# Patient Record
Sex: Male | Born: 1965 | Race: White | Hispanic: No | Marital: Single | Smoking: Never smoker
Health system: Southern US, Community
[De-identification: ages and names within clinical notes are randomized; demographics above are authoritative.]

## PROBLEM LIST (undated history)

## (undated) DIAGNOSIS — I10 Essential (primary) hypertension: Secondary | ICD-10-CM

## (undated) DIAGNOSIS — R739 Hyperglycemia, unspecified: Secondary | ICD-10-CM

## (undated) DIAGNOSIS — E785 Hyperlipidemia, unspecified: Secondary | ICD-10-CM

## (undated) DIAGNOSIS — K219 Gastro-esophageal reflux disease without esophagitis: Secondary | ICD-10-CM

## (undated) DIAGNOSIS — I2510 Atherosclerotic heart disease of native coronary artery without angina pectoris: Secondary | ICD-10-CM

## (undated) DIAGNOSIS — N189 Chronic kidney disease, unspecified: Secondary | ICD-10-CM

## (undated) HISTORY — PX: EYE SURGERY: SHX253

## (undated) HISTORY — DX: Hyperlipidemia, unspecified: E78.5

## (undated) HISTORY — DX: Essential (primary) hypertension: I10

## (undated) HISTORY — DX: Atherosclerotic heart disease of native coronary artery without angina pectoris: I25.10

## (undated) HISTORY — DX: Chronic kidney disease, unspecified: N18.9

## (undated) HISTORY — DX: Hyperglycemia, unspecified: R73.9

## (undated) HISTORY — DX: Gastro-esophageal reflux disease without esophagitis: K21.9

---

## 2011-03-17 ENCOUNTER — Inpatient Hospital Stay (HOSPITAL_COMMUNITY): Payer: Self-pay

## 2011-03-17 ENCOUNTER — Inpatient Hospital Stay (HOSPITAL_COMMUNITY)
Admission: EM | Admit: 2011-03-17 | Discharge: 2011-03-21 | DRG: 247 | Disposition: A | Discharge status: Home / Self Care | Payer: Self-pay | Source: Other Acute Inpatient Hospital | Attending: Cardiology | Admitting: Cardiology

## 2011-03-17 DIAGNOSIS — I2510 Atherosclerotic heart disease of native coronary artery without angina pectoris: Secondary | ICD-10-CM | POA: Diagnosis present

## 2011-03-17 DIAGNOSIS — R42 Dizziness and giddiness: Secondary | ICD-10-CM | POA: Diagnosis not present

## 2011-03-17 DIAGNOSIS — I369 Nonrheumatic tricuspid valve disorder, unspecified: Secondary | ICD-10-CM

## 2011-03-17 DIAGNOSIS — I2109 ST elevation (STEMI) myocardial infarction involving other coronary artery of anterior wall: Principal | ICD-10-CM | POA: Diagnosis present

## 2011-03-17 DIAGNOSIS — R079 Chest pain, unspecified: Secondary | ICD-10-CM

## 2011-03-17 DIAGNOSIS — I10 Essential (primary) hypertension: Secondary | ICD-10-CM | POA: Diagnosis present

## 2011-03-17 LAB — PROTIME-INR: INR: 4.58 — ABNORMAL HIGH (ref 0.00–1.49)

## 2011-03-17 LAB — CBC
HCT: 35.9 % — ABNORMAL LOW (ref 39.0–52.0)
MCH: 30.8 pg (ref 26.0–34.0)
MCHC: 36.5 g/dL — ABNORMAL HIGH (ref 30.0–36.0)
RDW: 12.7 % (ref 11.5–15.5)

## 2011-03-17 LAB — COMPREHENSIVE METABOLIC PANEL
Albumin: 3.5 g/dL (ref 3.5–5.2)
Alkaline Phosphatase: 88 U/L (ref 39–117)
BUN: 23 mg/dL (ref 6–23)
CO2: 20 mEq/L (ref 19–32)
Chloride: 103 mEq/L (ref 96–112)
Creatinine, Ser: 1.54 mg/dL — ABNORMAL HIGH (ref 0.4–1.5)
GFR calc non Af Amer: 50 mL/min — ABNORMAL LOW (ref >60)
Glucose, Bld: 158 mg/dL — ABNORMAL HIGH (ref 70–99)
Potassium: 3.5 mEq/L (ref 3.5–5.1)
Total Bilirubin: 0.9 mg/dL (ref 0.3–1.2)

## 2011-03-17 LAB — POCT I-STAT, CHEM 8
HCT: 41 % (ref 39.0–52.0)
Hemoglobin: 13.9 g/dL (ref 13.0–17.0)
Potassium: 3.3 mEq/L — ABNORMAL LOW (ref 3.5–5.1)
Sodium: 139 mEq/L (ref 135–145)
TCO2: 21 mmol/L (ref 0–100)

## 2011-03-17 LAB — CARDIAC PANEL(CRET KIN+CKTOT+MB+TROPI)
CK, MB: 210.2 ng/mL (ref 0.3–4.0)
CK, MB: 298.2 ng/mL (ref 0.3–4.0)
Relative Index: 11.8 — ABNORMAL HIGH (ref 0.0–2.5)
Total CK: 3512 U/L — ABNORMAL HIGH (ref 7–232)

## 2011-03-17 LAB — LIPID PANEL
Cholesterol: 181 mg/dL (ref 0–200)
LDL Cholesterol: 129 mg/dL — ABNORMAL HIGH (ref 0–99)
Triglycerides: 120 mg/dL (ref <150)

## 2011-03-17 LAB — HEMOGLOBIN A1C: Hgb A1c MFr Bld: 5.8 % — ABNORMAL HIGH (ref <5.7)

## 2011-03-17 LAB — APTT: aPTT: 183 seconds — ABNORMAL HIGH (ref 24–37)

## 2011-03-17 LAB — POCT ACTIVATED CLOTTING TIME: Activated Clotting Time: 358 seconds

## 2011-03-17 LAB — MRSA PCR SCREENING: MRSA by PCR: NEGATIVE

## 2011-03-18 LAB — CARDIAC PANEL(CRET KIN+CKTOT+MB+TROPI)
CK, MB: 159 ng/mL (ref 0.3–4.0)
Total CK: 2459 U/L — ABNORMAL HIGH (ref 7–232)
Troponin I: 74.99 ng/mL (ref 0.00–0.06)

## 2011-03-18 LAB — CBC
MCH: 30.7 pg (ref 26.0–34.0)
MCV: 85.8 fL (ref 78.0–100.0)
Platelets: 169 10*3/uL (ref 150–400)
RBC: 4.5 MIL/uL (ref 4.22–5.81)
RDW: 12.9 % (ref 11.5–15.5)
WBC: 9.5 10*3/uL (ref 4.0–10.5)

## 2011-03-18 LAB — BASIC METABOLIC PANEL
BUN: 15 mg/dL (ref 6–23)
Chloride: 103 mEq/L (ref 96–112)
Creatinine, Ser: 1.43 mg/dL (ref 0.4–1.5)
GFR calc Af Amer: >60 mL/min (ref >60)
GFR calc non Af Amer: 54 mL/min — ABNORMAL LOW (ref >60)

## 2011-03-19 LAB — CBC
Platelets: 179 10*3/uL (ref 150–400)
RBC: 4.32 MIL/uL (ref 4.22–5.81)
RDW: 12.8 % (ref 11.5–15.5)
WBC: 10.6 10*3/uL — ABNORMAL HIGH (ref 4.0–10.5)

## 2011-03-19 LAB — BASIC METABOLIC PANEL
Chloride: 102 mEq/L (ref 96–112)
GFR calc Af Amer: >60 mL/min (ref >60)
GFR calc non Af Amer: 50 mL/min — ABNORMAL LOW (ref >60)
Potassium: 4.3 mEq/L (ref 3.5–5.1)
Sodium: 136 mEq/L (ref 135–145)

## 2011-03-19 NOTE — Procedures (Signed)
NAMEKREG, EARHART               ACCOUNT NO.:  [id number]  MEDICAL RECORD NO.:  [id number]           PATIENT TYPE:  I  LOCATION:  2807                         FACILITY:  [facility]  PHYSICIAN:  Arturo Morton. Riley Kill, MD, FACCDATE OF BIRTH:  1966/04/08  DATE OF PROCEDURE:  03/17/2011 DATE OF DISCHARGE:                           CARDIAC CATHETERIZATION   INDICATIONS:  Mr. Pettway is a 45 year old with no prior cardiac history.  He has a history of hypertension treated with lisinopril.  He presented with an acute onset of chest pain.  He went from [place] to the [facility] emergency room where EKG was compatible with an anterior wall infarction.  EMS was activated, the cath lab was activated and he was brought emergently to the [facility] cath lab.  On arrival here, his pain was markedly improved.  We discussed with him quickly the indications for emergent catheterization and he was agreeable to proceed.  PROCEDURE: 1. Left heart catheterization. 2. Selective coronary arteriography. 3. Percutaneous stenting of the left anterior descending artery with a     drug-eluting stent.  DESCRIPTION OF PROCEDURE:  The patient was brought to the catheterization laboratory and prepped and draped in the usual fashion. We elected to use the right radial artery.  His Allen test was normal. We were able to gain access through the right radial artery and a 6- French sheath was placed.  We immediately obtained an i-STAT creatinine and hemoglobin.  Creatinine was elevated at 1.7 so plans were made to do this without ventriculography.  Views of the right coronary artery were obtained followed by views of the left coronary artery using a JL-3.5. Intra-arterial verapamil 3 mg was administered at the beginning, the patient had already received heparin.  With this, the patient was noted to have a subtotal occlusion of the LAD at bifurcation.  We gave bivalirudin, and Prasugrel, aspirin had already been given.  ACT  was checked and found to be appropriate for percutaneous intervention.  We wired the diagonal branch using a traverse wire and wired the main branch using a Prowater wire.  Dilatation was done in the main branch with a 2 mm and a 2.5 mm balloon.  There was a fair amount of elastic recoil.  As a result, I elected to go ahead and stent the left anterior descending artery.  A 2.75 x 16 drug-eluting PROMUS Element stent was then passed across the lesion, dilatation was done up to about 10 atmospheres.  We wanted to see the preservation of the diagonal branch. Postdilatation was then done with a 3-mm [place] trek balloon.  We went back and after the first set of post dilatations and went back and did a 15- atmosphere inflation.  The diagonal branch remained patent.  A wire had been left in place and was jailed.  We pulled back the other wire, we were able to cross into the area of the wire.  The J-wire was then removed without difficulty.  After several views of keeping the patient on the table, the diagonal remained patent as a result we elected not to dilate the diagonal branch.  There was TIMI 3  flow in this branch.  At the beginning of the case, there was TIMI 2 flow in the left anterior descending artery.  Left ventricular pressures were measured at the end of the case.  Additional views of the LAD, specifically scout views were now obtained to reduce contrast load.  A ventriculography was also not performed with plan of performing 2-D echocardiography.  Of note, during the course of the procedure, the patient was accelerated to a ventricular rhythm.  He converted intermittently to normal sinus rhythm, but most of the time was in AIVR with pressure in the 80s and 90s, with conversion to sinus rhythm his pressure came up into the 100s. A TR band was placed at the completion of the procedure and the patient was taken to the holding area in satisfactory clinical condition.  HEMODYNAMIC  DATA.: 1. The central aortic pressure was 92/72 with a mean of 81. 2. RV pressure 89/16. 3. There was no gradient across the aortic valve.  ANGIOGRAPHIC DATA: 1. The right coronary artery has mild luminal irregularity without     critical stenoses. 2. The left main is free of critical disease. 3. The LAD has a subtotal occlusion with 95% narrowing at the takeoff     of the first diagonal, the first diagonal has about 70% ostial     narrowing.  Following the balloon dilatation and stenting, this 95%     stenosis was reduced to 0% residual luminal narrowing.  The     diagonal branch remained patent with TIMI 3 flow, it was perhaps     blood tinged slightly more, but again with TIMI 3 flow further     dilatations were not done to avoid deformation of the stent.  The     left anterior descending artery demonstrates an additional 50% area     of narrowing.  The takeoff of the second diagonal which itself has     a 70% ostial stenosis.  It does appear to be patent best seen in     the LAO caudal views.  There was a distal area of modest narrowing,     but this was in an area of pseudo stenoses.  This was not felt to     be high-grade.  After completion of the procedure, there was TIMI 3     flow to the apex.  CONCLUSIONS: 1. Acute anterior wall myocardial infarction due to occlusion of the     left anterior descending artery with successful percutaneous     stenting of the bifurcation lesion. 2. Continued patency of the first diagonal. 3. Modest plaque of the distal LAD and diagonal branches as noted     above.  DISPOSITION: 1. The patient will be treated with aspirin and Prasugrel. 2. When he resumes to sinus, we will get beta blockers started. 3. He will be started on simvastatin 40 mg, so his medication remains     affordable as he is unemployed. 4. Care management would be obtained. 5. Cardiac rehabilitation will be obtained.  ADDENDUM:  Time of arrival to first inflation was  approximately 38 minutes at the transferring facility.     Arturo Morton. Riley Kill, MD, [facility]     TDS/MEDQ  D:  03/17/2011  T:  03/17/2011  Job:  [id number]  Electronically Signed by Shawnie Pons MD [facility] on 03/19/2011 05:45:10 AM

## 2011-03-21 DIAGNOSIS — I2109 ST elevation (STEMI) myocardial infarction involving other coronary artery of anterior wall: Secondary | ICD-10-CM

## 2011-03-26 NOTE — Discharge Summary (Signed)
Jason Daugherty, Jason Daugherty               ACCOUNT NO.:  [id number]  MEDICAL RECORD NO.:  [id number]           PATIENT TYPE:  I  LOCATION:  2024                         FACILITY:  [facility]  PHYSICIAN:  Jason Pick. Jason Emms, MD, FACCDATE OF BIRTH:  Sep 23, 1966  DATE OF ADMISSION:  03/17/2011 DATE OF DISCHARGE:  03/21/2011                              DISCHARGE SUMMARY   PROCEDURES: 1. Cardiac catheterization. 2. Coronary arteriogram. 3. 2.75 x 16-mm PROMUS drug-eluting stent to the left anterior     descending (coronary) artery.Marland Kitchen 4. Two-D echocardiogram. 5. Chest x-ray.  PRIMARY FINAL DISCHARGE DIAGNOSIS:  Acute anterior ST elevation myocardial infarction.  SECONDARY DIAGNOSES: 1. Hyperglycemia with blood sugars between 118 and 198, hemoglobin A1c     5.8. 2. Dyslipidemia with a total cholesterol of 181, triglycerides 120,     HDL 28, LDL 129. 3. Renal insufficiency, duration unknown with a BUN of 20, creatinine     1.54, and GFR of 50 at discharge. 4. Hypertension. 5. Gout. 6. Gastroesophageal reflux disease. 7. History of eye surgery as a child.  FAMILY HISTORY:  Coronary artery disease in his father.  TIME OF DISCHARGE:  41 minutes.  HOSPITAL COURSE:  Jason Daugherty is a 45 year old male with no previous history of coronary artery disease.  He had chest pain and went to [facility] where his EKG and symptoms indicated an ST elevation MI.  He received appropriate treatment and was transferred urgently to [facility] where he was taken directly to the cath lab.  Jason Daugherty had a 95% LAD lesion at the first diagonal which was treated with PTCA and a drug-eluting stent reducing the stenosis to zero.  He had a more distal lesion in the LAD at 50% which was at the second diagonal.  The second diagonal had a 70% stenosis.  His creatinine was elevated at 1.7 so no LV gram was done.  The circumflex and RCA systems were without disease.  He was admitted for further evaluation.  A lipid  profile as described above and he was started on a statin.  He was on Effient and aspirin for anticoagulation.  A beta blocker was added to his medication regimen and his lisinopril was held.  He was seen by Cardiac Rehab.  A 2-D echocardiogram showed septal, apical and distal inferior wall akinesis with otherwise hyperdynamic function, moderate concentric LVH and an EF of 40-45%.  His peak troponin was greater than 100 and his peak CK-MB was 3512/298.2.  He was held for 48 hours.  Medical assistance form was filled out for Effient.  IMPRESSION:  On March 21, 2011, Jason Daugherty was ambulating without chest pain or shortness of breath.  He was evaluated by Dr. Eden Daugherty and considered stable for discharge, to follow up as an outpatient.  DISCHARGE INSTRUCTIONS:  His activity level is to be increased gradually.  He is to call our office for problems with the cath site. He is encouraged to stick to a low-sodium heart-healthy diet.  He is to follow up with Dr. Riley Daugherty and our office will call him with an appointment.  He is to follow up with  the clinic in [place] as scheduled.  DISCHARGE MEDICATIONS: 1. Lopressor 25 mg b.i.d. 2. Pravachol 40 mg at bedtime. 3. Aspirin 81 mg a day. 4. Prasugrel 10 mg daily. 5. Prilosec 20 mg daily. 6. Cherry supplement for gout as prior to admission. 7. Sublingual nitroglycerin p.r.n.     Jason Demark, PA-C   ______________________________ Jason Pick. Jason Emms, MD, [facility]    RB/MEDQ  D:  03/21/2011  T:  03/22/2011  Job:  [id number]  Electronically Signed by Jason Demark PA-C on 03/25/2011 09:45:06 AM Electronically Signed by Jason Haws MD FACC on 03/26/2011 11:31:43 AM

## 2011-03-31 ENCOUNTER — Telehealth: Payer: Self-pay

## 2011-03-31 NOTE — Telephone Encounter (Signed)
I spoke with the pt and made him aware that he was approved for [employer] (Effient). Medication placed at the front desk for pick-up.  The pt will get this at his upcoming appt next week. Order #[id number], PO #[id number]

## 2011-04-04 ENCOUNTER — Encounter: Payer: Self-pay | Admitting: Physician Assistant

## 2011-04-08 ENCOUNTER — Ambulatory Visit (INDEPENDENT_AMBULATORY_CARE_PROVIDER_SITE_OTHER): Payer: Self-pay | Admitting: Physician Assistant

## 2011-04-08 ENCOUNTER — Other Ambulatory Visit: Payer: Self-pay | Admitting: *Deleted

## 2011-04-08 ENCOUNTER — Encounter: Payer: Self-pay | Admitting: Physician Assistant

## 2011-04-08 VITALS — BP 112/78 | HR 56 | Ht 70.0 in | Wt 184.4 lb

## 2011-04-08 DIAGNOSIS — K219 Gastro-esophageal reflux disease without esophagitis: Secondary | ICD-10-CM | POA: Insufficient documentation

## 2011-04-08 DIAGNOSIS — M109 Gout, unspecified: Secondary | ICD-10-CM | POA: Insufficient documentation

## 2011-04-08 DIAGNOSIS — E785 Hyperlipidemia, unspecified: Secondary | ICD-10-CM

## 2011-04-08 DIAGNOSIS — N189 Chronic kidney disease, unspecified: Secondary | ICD-10-CM | POA: Insufficient documentation

## 2011-04-08 DIAGNOSIS — I2510 Atherosclerotic heart disease of native coronary artery without angina pectoris: Secondary | ICD-10-CM | POA: Insufficient documentation

## 2011-04-08 DIAGNOSIS — I10 Essential (primary) hypertension: Secondary | ICD-10-CM

## 2011-04-08 DIAGNOSIS — R739 Hyperglycemia, unspecified: Secondary | ICD-10-CM | POA: Insufficient documentation

## 2011-04-08 LAB — BASIC METABOLIC PANEL
CO2: 28 mEq/L (ref 19–32)
Calcium: 9.3 mg/dL (ref 8.4–10.5)
Chloride: 100 mEq/L (ref 96–112)
Glucose, Bld: 102 mg/dL — ABNORMAL HIGH (ref 70–99)
Potassium: 3.7 mEq/L (ref 3.5–5.1)
Sodium: 137 mEq/L (ref 135–145)

## 2011-04-08 MED ORDER — LISINOPRIL 2.5 MG PO TABS: 2.5000 mg | ORAL_TABLET | Freq: Every day | ORAL | Status: DC

## 2011-04-08 NOTE — Progress Notes (Signed)
History of Present Illness: Primary Cardiologist:  Dr.  Shawnie Pons  Jason Daugherty is a 45 y.o. male with a h/o HTN and GERD who was awoken by chest pain 4/10 and presented to the [facility] ED and ECG confirmed anterior STEMI.  He was transferred emergently to [facility] where cardiac catheterization demonstrated a 95% lesion in the LAD, then 50% as well as a 70% lesion in the second diagonal.  His high-grade LAD lesion was treated with a Promus drug-eluting stent.  He was noted to have an elevated creatinine and post procedure echocardiogram demonstrated reduced ejection fraction of 40-45%.  He was placed on aspirin and Effient as well as pravastatin.  He presents today for follow up.  He is doing well.  He denies chest pain, shortness of breath, syncope.  He denies orthopnea, PND or edema.  He does feel somewhat fatigued.  However, he has been able to walk about 30 minutes a day without any problems.  He has been accepted for assistance with cardiac rehabilitation.  He starts that soon.  He has also been approved for assistance with Effient and needs to pick up his medications today.  Past Medical History  Diagnosis Date  . CAD (coronary artery disease)     a. s/p Ant. STEMI 03/17/11 tx with Promus DES to LAD;  b. cath 03/17/11: LAD 95% (PCI), then 50%, D2 70%;  c. echo 03/17/11: EF 40-45%, septal, apical, distal Ant AK, mod LVH  . Hyperglycemia     a. A1C 5.8 (4/12)  . Dyslipidemia   . CKD (chronic kidney disease)   . HTN (hypertension)   . Gout   . GERD (gastroesophageal reflux disease)     Current Outpatient Prescriptions  Medication Sig Dispense Refill  . aspirin 81 MG tablet Take 81 mg by mouth daily.        Marland Kitchen CHERRY PO Take by mouth daily.        . metoprolol tartrate (LOPRESSOR) 25 MG tablet Take 25 mg by mouth 2 (two) times daily.        . nitroGLYCERIN (NITROSTAT) 0.4 MG SL tablet Place 0.4 mg under the tongue every 5 (five) minutes as needed.        Marland Kitchen omeprazole  (PRILOSEC) 20 MG capsule Take 20 mg by mouth as needed.       . prasugrel (EFFIENT) 10 MG TABS Take 10 mg by mouth daily.        . pravastatin (PRAVACHOL) 40 MG tablet Take 40 mg by mouth daily.          No Known Allergies  Vital Signs: BP 112/78  Pulse 56  Ht 5\' 10"  (1.778 m)  Wt 184 lb 6.4 oz (83.643 kg)  BMI 26.46 kg/m2  PHYSICAL EXAM: Well nourished, well developed, in no acute distress HEENT: normal Neck: no JVD Cardiac:  normal S1, S2; RRR; no murmur Lungs:  clear to auscultation bilaterally, no wheezing, rhonchi or rales Abd: soft, nontender, no hepatomegaly Ext: no edema; Right radial site without hematoma or bruit Skin: warm and dry Neuro:  CNs 2-12 intact, no focal abnormalities noted  EKG:  Sinus bradycardia, heart rate 56, normal axis, J-point elevation inferiorly, Q waves in V1-V2 with associated T-wave inversions, no significant change when compared to prior tracing dated 03/18/11  ASSESSMENT AND PLAN:

## 2011-04-08 NOTE — Assessment & Plan Note (Signed)
Controlled.  Continue current therapy.  

## 2011-04-08 NOTE — Patient Instructions (Signed)
You have been referred to [facility]ER [facility] OR [place] [facility] TO ESTABLISH WITH A PRIMARY CARE PHYSICIAN AS PER SCOTT WEAVER, PA-C.  Your physician recommends that you return for lab work in: TODAY BMET 401.9  Your physician recommends that you return for lab work in: 6-8 WEEKS FASTING LIPID AND LIVER PANEL 272.4 ON THE SAME DAY AS APPT TO SEE DR. Riley Kill  AS PER SCOTT WEAVER, PA-C.  Your physician recommends that you schedule a follow-up appointment in: 6-8 WEEKS WITH DR. Riley Kill AS PER SCOTT WEAVER, PA-C.

## 2011-04-08 NOTE — Assessment & Plan Note (Signed)
Check a basic metabolic panel today for follow up.

## 2011-04-08 NOTE — Assessment & Plan Note (Signed)
Doing well post MI.  His EF was 40-45% and echocardiogram.  This will likely improve with reperfusion and I suspect he will have another echocardiogram done in 2-3 months.  It would be nice to have him on an ACE inhibitor.  He would probably be able to tolerate lisinopril 2.5 mg.  However, he had an elevated creatinine in the hospital.  Basic metabolic panel will be checked today.  If his creatinine is acceptable, we can try to place him on a small dose of ACE inhibitor with close follow up.  He will start cardiac rehabilitation soon.  He will follow up with Dr. Tedra Senegal in 6-8 weeks.

## 2011-04-08 NOTE — Telephone Encounter (Signed)
REFILL DONE TODAY.

## 2011-04-08 NOTE — Assessment & Plan Note (Signed)
I will refer him to primary care.

## 2011-04-08 NOTE — Assessment & Plan Note (Signed)
Check lipids and LFTs in 6-8 weeks. 

## 2011-04-13 ENCOUNTER — Other Ambulatory Visit: Payer: Self-pay | Admitting: Cardiology

## 2011-04-13 ENCOUNTER — Encounter (HOSPITAL_COMMUNITY): Payer: Self-pay | Attending: Cardiology

## 2011-04-13 DIAGNOSIS — Z9861 Coronary angioplasty status: Secondary | ICD-10-CM | POA: Insufficient documentation

## 2011-04-13 DIAGNOSIS — R42 Dizziness and giddiness: Secondary | ICD-10-CM | POA: Insufficient documentation

## 2011-04-13 DIAGNOSIS — I10 Essential (primary) hypertension: Secondary | ICD-10-CM | POA: Insufficient documentation

## 2011-04-13 DIAGNOSIS — Z5189 Encounter for other specified aftercare: Secondary | ICD-10-CM | POA: Insufficient documentation

## 2011-04-13 DIAGNOSIS — I2109 ST elevation (STEMI) myocardial infarction involving other coronary artery of anterior wall: Secondary | ICD-10-CM | POA: Insufficient documentation

## 2011-04-13 DIAGNOSIS — I2510 Atherosclerotic heart disease of native coronary artery without angina pectoris: Secondary | ICD-10-CM | POA: Insufficient documentation

## 2011-04-13 LAB — GLUCOSE, CAPILLARY: Glucose-Capillary: 145 mg/dL — ABNORMAL HIGH (ref 70–99)

## 2011-04-15 ENCOUNTER — Encounter (HOSPITAL_COMMUNITY): Payer: Self-pay

## 2011-04-15 ENCOUNTER — Other Ambulatory Visit: Payer: Self-pay | Admitting: Cardiology

## 2011-04-16 ENCOUNTER — Telehealth: Payer: Self-pay | Admitting: *Deleted

## 2011-04-16 ENCOUNTER — Other Ambulatory Visit (INDEPENDENT_AMBULATORY_CARE_PROVIDER_SITE_OTHER): Payer: Self-pay | Admitting: *Deleted

## 2011-04-16 DIAGNOSIS — N189 Chronic kidney disease, unspecified: Secondary | ICD-10-CM

## 2011-04-16 DIAGNOSIS — I10 Essential (primary) hypertension: Secondary | ICD-10-CM

## 2011-04-16 LAB — BASIC METABOLIC PANEL
Calcium: 9.8 mg/dL (ref 8.4–10.5)
GFR: 60.32 mL/min (ref >60.00)
Sodium: 139 mEq/L (ref 135–145)

## 2011-04-16 LAB — GLUCOSE, CAPILLARY: Glucose-Capillary: 129 mg/dL — ABNORMAL HIGH (ref 70–99)

## 2011-04-16 NOTE — Telephone Encounter (Signed)
Repeat labs:

## 2011-04-17 ENCOUNTER — Encounter (HOSPITAL_COMMUNITY): Payer: Self-pay

## 2011-04-17 ENCOUNTER — Other Ambulatory Visit: Payer: Self-pay | Admitting: Cardiology

## 2011-04-17 LAB — GLUCOSE, CAPILLARY: Glucose-Capillary: 98 mg/dL (ref 70–99)

## 2011-04-20 ENCOUNTER — Encounter (HOSPITAL_COMMUNITY): Payer: Self-pay

## 2011-04-22 ENCOUNTER — Encounter (HOSPITAL_COMMUNITY): Payer: Self-pay

## 2011-04-23 ENCOUNTER — Other Ambulatory Visit (INDEPENDENT_AMBULATORY_CARE_PROVIDER_SITE_OTHER): Payer: Self-pay | Admitting: *Deleted

## 2011-04-23 DIAGNOSIS — N189 Chronic kidney disease, unspecified: Secondary | ICD-10-CM

## 2011-04-23 DIAGNOSIS — E785 Hyperlipidemia, unspecified: Secondary | ICD-10-CM

## 2011-04-23 LAB — BASIC METABOLIC PANEL
BUN: 17 mg/dL (ref 6–23)
Calcium: 9.8 mg/dL (ref 8.4–10.5)
Creatinine, Ser: 1.2 mg/dL (ref 0.4–1.5)
GFR: 72.37 mL/min (ref >60.00)

## 2011-04-23 LAB — LIPID PANEL
Cholesterol: 112 mg/dL (ref 0–200)
LDL Cholesterol: 64 mg/dL (ref 0–99)
Triglycerides: 111 mg/dL (ref 0.0–149.0)
VLDL: 22.2 mg/dL (ref 0.0–40.0)

## 2011-04-23 LAB — HEPATIC FUNCTION PANEL
AST: 97 U/L — ABNORMAL HIGH (ref 0–37)
Total Bilirubin: 0.6 mg/dL (ref 0.3–1.2)

## 2011-04-24 ENCOUNTER — Telehealth: Payer: Self-pay | Admitting: *Deleted

## 2011-04-24 ENCOUNTER — Encounter (HOSPITAL_COMMUNITY): Payer: Self-pay

## 2011-04-24 DIAGNOSIS — E785 Hyperlipidemia, unspecified: Secondary | ICD-10-CM

## 2011-04-27 ENCOUNTER — Encounter (HOSPITAL_COMMUNITY): Payer: Self-pay

## 2011-04-27 NOTE — Telephone Encounter (Signed)
See phone note

## 2011-04-28 ENCOUNTER — Other Ambulatory Visit: Payer: Self-pay | Admitting: Physician Assistant

## 2011-04-28 DIAGNOSIS — Q447 Other congenital malformations of liver: Secondary | ICD-10-CM

## 2011-04-29 ENCOUNTER — Encounter (HOSPITAL_COMMUNITY): Payer: Self-pay

## 2011-05-01 ENCOUNTER — Other Ambulatory Visit (INDEPENDENT_AMBULATORY_CARE_PROVIDER_SITE_OTHER): Payer: Self-pay | Admitting: *Deleted

## 2011-05-01 ENCOUNTER — Encounter (HOSPITAL_COMMUNITY): Payer: Self-pay

## 2011-05-01 DIAGNOSIS — E785 Hyperlipidemia, unspecified: Secondary | ICD-10-CM

## 2011-05-01 LAB — HEPATIC FUNCTION PANEL: Total Bilirubin: 1.3 mg/dL — ABNORMAL HIGH (ref 0.3–1.2)

## 2011-05-02 LAB — HEPATITIS A ANTIBODY, TOTAL: Hep A Total Ab: NEGATIVE

## 2011-05-02 LAB — HEPATITIS B CORE ANTIBODY, TOTAL: Hep B Core Total Ab: NEGATIVE

## 2011-05-04 ENCOUNTER — Encounter (HOSPITAL_COMMUNITY): Payer: Self-pay

## 2011-05-05 ENCOUNTER — Ambulatory Visit (HOSPITAL_COMMUNITY)
Admission: RE | Admit: 2011-05-05 | Discharge: 2011-05-05 | Disposition: A | Discharge status: Home / Self Care | Payer: Self-pay | Source: Ambulatory Visit | Attending: Physician Assistant | Admitting: Physician Assistant

## 2011-05-05 DIAGNOSIS — Q447 Other congenital malformations of liver: Secondary | ICD-10-CM | POA: Insufficient documentation

## 2011-05-05 DIAGNOSIS — R109 Unspecified abdominal pain: Secondary | ICD-10-CM | POA: Insufficient documentation

## 2011-05-05 DIAGNOSIS — Q4479 Other congenital malformations of liver: Secondary | ICD-10-CM | POA: Insufficient documentation

## 2011-05-05 DIAGNOSIS — Q441 Other congenital malformations of gallbladder: Secondary | ICD-10-CM | POA: Insufficient documentation

## 2011-05-05 DIAGNOSIS — R945 Abnormal results of liver function studies: Secondary | ICD-10-CM | POA: Insufficient documentation

## 2011-05-06 ENCOUNTER — Encounter (HOSPITAL_COMMUNITY): Payer: Self-pay

## 2011-05-07 ENCOUNTER — Telehealth: Payer: Self-pay | Admitting: *Deleted

## 2011-05-07 DIAGNOSIS — E785 Hyperlipidemia, unspecified: Secondary | ICD-10-CM

## 2011-05-07 NOTE — Telephone Encounter (Signed)
See phone note

## 2011-05-07 NOTE — H&P (Signed)
NAMECARSTEN, CARSTARPHEN               ACCOUNT NO.:  [id number]  MEDICAL RECORD NO.:  [id number]           PATIENT TYPE:  I  LOCATION:  2911                         FACILITY:  [facility]  PHYSICIAN:  Arturo Morton. Riley Kill, MD, FACCDATE OF BIRTH:  February 28, 1966  DATE OF ADMISSION:  03/17/2011 DATE OF DISCHARGE:                             HISTORY & PHYSICAL   PRIMARY CARE PHYSICIAN:  A clinic in [place].  PRIMARY CARDIOLOGIST:  None.  CHIEF COMPLAINT:  Chest pain.  HISTORY OF PRESENT ILLNESS:  Mr. Jason Daugherty is a 45 year old male with no previous history of coronary artery disease.  He was wakened by chest pain at 2:00 a.m. today.  He went to [facility] after taking two baby aspirin.  At [facility], his EKG was acutely abnormal indicating an ST-elevation MI.  He was given two more baby aspirin and started on heparin and nitro and normal saline.  He received a 5000-unit bolus and a 1000u an hour.  He was transferred emergently to [facility] and taken directly to the cath lab.  He describes the pain as a pressure, and it was associated with shortness of breath and some diaphoresis.  It was initially a 9/10 and at transfer was a 6/10.  He has never had symptoms like this before.  PAST MEDICAL HISTORY: 1. Hypertension. 2. Gout. 3. Gastroesophageal reflux disease.  SURGICAL HISTORY:  He had surgery as a child.  ALLERGIES:  No known drug allergies.  CURRENT MEDICATIONS: 1. Lisinopril 20 mg daily. 2. Cherry supplement over the counter for gout. 3. Occasional aspirin.  SOCIAL HISTORY:  He lives in [place], [place], and has a roommate.  He is unemployed from Physicist, medical.  He denies any history of alcohol, tobacco, or drug abuse.  He does not exercise regularly.  FAMILY HISTORY:  His father died of an MI at 59.  His mother is in her 14s and alive without any history of coronary artery disease and his sister does not have coronary artery disease.  REVIEW OF SYSTEMS:   He has occasional bright red blood per rectum and the last time was about a week ago.  His reflux symptoms are fairly well controlled with over-the-counter Prilosec.  He has occasional arthralgias and joint pains.  Full 14-point review of systems is, otherwise, negative except as stated in HPI.  PHYSICAL EXAMINATION:  GENERAL:  He is a well-developed, well-nourished white male, in no acute distress post cath. HEENT:  Normal. NECK:  No lymphadenopathy, thyromegaly, bruit, or JVD normal. CARDIOVASCULAR:  His heart is regular rate and rhythm with an S1 and S2 and no significant murmur, rub, or gallop is noted.  Distal pulses are intact in all four extremities. LUNGS:  Clear to auscultation bilaterally. SKIN:  No rashes or lesions are noted. ABDOMEN:  Soft and nontender with active bowel sounds. EXTREMITIES:  There is no cyanosis, clubbing, or edema noted. MUSCULOSKELETAL:  There is no joint deformity or effusions and no spine or CVA tenderness. NEUROLOGIC:  He is alert and oriented.  Cranial nerves II through XII grossly intact.  EKG is sinus rhythm, rate 63 with ST elevation in  leads V1 and V2 as well as III and aVF.  LABORATORY VALUES:  Hemoglobin 13.1, hematocrit 35.9, WBCs 15.1, platelets 168.  Sodium 134, potassium 3.5, chloride 103, CO2 is 20, BUN 23, creatinine 1.54, glucose 158.  SGOT 111, other LFTs within normal limits.  CK-MB 1783/210.2 with a troponin I of 25.35, total cholesterol 181, triglycerides 120, HDL 28, LDL 129.  IMPRESSION:  Mr. Hazard was seen and evaluated by Dr. Riley Kill and taken directly to the cath lab.  His EKG and symptoms are consistent with an acute ST-elevation myocardial infarction.  He will be cathed urgently with further evaluation and treatment depending on the results.  We will screen for cardiac risk factors.     Theodore Demark, PA-C   ______________________________ Arturo Morton. Riley Kill, MD, [facility]    RB/MEDQ  D:  03/17/2011  T:   03/17/2011  Job:  [id number]  Electronically Signed by Theodore Demark PA-C on 04/06/2011 11:28:38 AM Electronically Signed by Shawnie Pons MD [facility] on 05/07/2011 09:15:13 AM

## 2011-05-08 ENCOUNTER — Encounter (HOSPITAL_COMMUNITY): Payer: Self-pay | Attending: Cardiology

## 2011-05-08 DIAGNOSIS — Z5189 Encounter for other specified aftercare: Secondary | ICD-10-CM | POA: Insufficient documentation

## 2011-05-08 DIAGNOSIS — R42 Dizziness and giddiness: Secondary | ICD-10-CM | POA: Insufficient documentation

## 2011-05-08 DIAGNOSIS — I10 Essential (primary) hypertension: Secondary | ICD-10-CM | POA: Insufficient documentation

## 2011-05-08 DIAGNOSIS — I2510 Atherosclerotic heart disease of native coronary artery without angina pectoris: Secondary | ICD-10-CM | POA: Insufficient documentation

## 2011-05-08 DIAGNOSIS — Z9861 Coronary angioplasty status: Secondary | ICD-10-CM | POA: Insufficient documentation

## 2011-05-08 DIAGNOSIS — I2109 ST elevation (STEMI) myocardial infarction involving other coronary artery of anterior wall: Secondary | ICD-10-CM | POA: Insufficient documentation

## 2011-05-11 ENCOUNTER — Encounter (HOSPITAL_COMMUNITY): Payer: Self-pay

## 2011-05-13 ENCOUNTER — Encounter (HOSPITAL_COMMUNITY): Payer: Self-pay

## 2011-05-15 ENCOUNTER — Encounter (HOSPITAL_COMMUNITY): Payer: Self-pay

## 2011-05-18 ENCOUNTER — Encounter (HOSPITAL_COMMUNITY): Payer: Self-pay

## 2011-05-20 ENCOUNTER — Encounter (HOSPITAL_COMMUNITY): Payer: Self-pay

## 2011-05-21 ENCOUNTER — Other Ambulatory Visit (INDEPENDENT_AMBULATORY_CARE_PROVIDER_SITE_OTHER): Payer: Self-pay | Admitting: *Deleted

## 2011-05-21 DIAGNOSIS — E785 Hyperlipidemia, unspecified: Secondary | ICD-10-CM

## 2011-05-21 LAB — HEPATIC FUNCTION PANEL
ALT: 35 U/L (ref 0–53)
Albumin: 4.4 g/dL (ref 3.5–5.2)
Total Bilirubin: 1 mg/dL (ref 0.3–1.2)
Total Protein: 7.5 g/dL (ref 6.0–8.3)

## 2011-05-22 ENCOUNTER — Encounter (HOSPITAL_COMMUNITY): Payer: Self-pay

## 2011-05-25 ENCOUNTER — Telehealth: Payer: Self-pay | Admitting: Physician Assistant

## 2011-05-25 ENCOUNTER — Encounter (HOSPITAL_COMMUNITY): Payer: Self-pay

## 2011-05-25 ENCOUNTER — Telehealth: Payer: Self-pay | Admitting: *Deleted

## 2011-05-25 DIAGNOSIS — E785 Hyperlipidemia, unspecified: Secondary | ICD-10-CM

## 2011-05-25 MED ORDER — PRAVASTATIN SODIUM 20 MG PO TABS: 20.0000 mg | ORAL_TABLET | Freq: Every day | ORAL | Status: DC

## 2011-05-25 NOTE — Telephone Encounter (Signed)
Pt aware of lab results and repeat lft's to be drawn 06/05/11 same day he sees dr. Riley Kill

## 2011-05-25 NOTE — Telephone Encounter (Signed)
Returning call back to speak with carol.

## 2011-05-25 NOTE — Telephone Encounter (Signed)
lmom for ptcb for lab results and to advise to start pravastatin 20 and repeat lft 06/09/11

## 2011-05-27 ENCOUNTER — Encounter (HOSPITAL_COMMUNITY): Payer: Self-pay

## 2011-05-29 ENCOUNTER — Encounter (HOSPITAL_COMMUNITY): Payer: Self-pay

## 2011-06-01 ENCOUNTER — Encounter (HOSPITAL_COMMUNITY): Payer: Self-pay

## 2011-06-03 ENCOUNTER — Encounter (HOSPITAL_COMMUNITY): Payer: Self-pay

## 2011-06-05 ENCOUNTER — Encounter (HOSPITAL_COMMUNITY): Payer: Self-pay

## 2011-06-05 ENCOUNTER — Ambulatory Visit (INDEPENDENT_AMBULATORY_CARE_PROVIDER_SITE_OTHER): Payer: Self-pay | Admitting: Cardiology

## 2011-06-05 ENCOUNTER — Encounter: Payer: Self-pay | Admitting: Cardiology

## 2011-06-05 ENCOUNTER — Other Ambulatory Visit: Payer: Self-pay | Admitting: *Deleted

## 2011-06-05 DIAGNOSIS — I10 Essential (primary) hypertension: Secondary | ICD-10-CM

## 2011-06-05 DIAGNOSIS — E785 Hyperlipidemia, unspecified: Secondary | ICD-10-CM

## 2011-06-05 DIAGNOSIS — E7800 Pure hypercholesterolemia, unspecified: Secondary | ICD-10-CM

## 2011-06-05 DIAGNOSIS — I2510 Atherosclerotic heart disease of native coronary artery without angina pectoris: Secondary | ICD-10-CM

## 2011-06-05 NOTE — Patient Instructions (Signed)
Please follow up with Dr Riley Kill in 2 months Please have fasting lipid and liver drawn next week Please continue all medications as listed

## 2011-06-08 ENCOUNTER — Encounter (HOSPITAL_COMMUNITY): Payer: Self-pay | Attending: Cardiology

## 2011-06-08 DIAGNOSIS — I2510 Atherosclerotic heart disease of native coronary artery without angina pectoris: Secondary | ICD-10-CM | POA: Insufficient documentation

## 2011-06-08 DIAGNOSIS — I10 Essential (primary) hypertension: Secondary | ICD-10-CM | POA: Insufficient documentation

## 2011-06-08 DIAGNOSIS — I2109 ST elevation (STEMI) myocardial infarction involving other coronary artery of anterior wall: Secondary | ICD-10-CM | POA: Insufficient documentation

## 2011-06-08 DIAGNOSIS — Z5189 Encounter for other specified aftercare: Secondary | ICD-10-CM | POA: Insufficient documentation

## 2011-06-08 DIAGNOSIS — R42 Dizziness and giddiness: Secondary | ICD-10-CM | POA: Insufficient documentation

## 2011-06-08 DIAGNOSIS — Z9861 Coronary angioplasty status: Secondary | ICD-10-CM | POA: Insufficient documentation

## 2011-06-09 ENCOUNTER — Other Ambulatory Visit: Payer: Self-pay | Admitting: *Deleted

## 2011-06-10 ENCOUNTER — Encounter (HOSPITAL_COMMUNITY): Payer: Self-pay

## 2011-06-12 ENCOUNTER — Other Ambulatory Visit (INDEPENDENT_AMBULATORY_CARE_PROVIDER_SITE_OTHER): Payer: Self-pay | Admitting: *Deleted

## 2011-06-12 ENCOUNTER — Encounter (HOSPITAL_COMMUNITY): Payer: Self-pay

## 2011-06-12 DIAGNOSIS — I2510 Atherosclerotic heart disease of native coronary artery without angina pectoris: Secondary | ICD-10-CM

## 2011-06-12 DIAGNOSIS — E785 Hyperlipidemia, unspecified: Secondary | ICD-10-CM

## 2011-06-12 LAB — HEPATIC FUNCTION PANEL
Albumin: 4.5 g/dL (ref 3.5–5.2)
Total Protein: 7 g/dL (ref 6.0–8.3)

## 2011-06-12 LAB — LIPID PANEL
HDL: 32.5 mg/dL — ABNORMAL LOW (ref >39.00)
Triglycerides: 102 mg/dL (ref 0.0–149.0)

## 2011-06-15 ENCOUNTER — Encounter (HOSPITAL_COMMUNITY): Payer: Self-pay

## 2011-06-16 ENCOUNTER — Telehealth: Payer: Self-pay | Admitting: *Deleted

## 2011-06-16 DIAGNOSIS — E785 Hyperlipidemia, unspecified: Secondary | ICD-10-CM

## 2011-06-16 NOTE — Telephone Encounter (Signed)
Repeat lft 07/01/11 pt aware of labs and appt. Danielle Rankin

## 2011-06-17 ENCOUNTER — Encounter (HOSPITAL_COMMUNITY): Payer: Self-pay

## 2011-06-19 ENCOUNTER — Encounter (HOSPITAL_COMMUNITY): Payer: Self-pay

## 2011-06-21 NOTE — Progress Notes (Signed)
HPI:  Jason Daugherty is seen in follow up.  Overall he is stable. He had a drug eluting stent placed in the LAD during infarction overlying the origin of a diagonal branch.  Overall he is doing well.  Denies any chest pain.  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  He has no primary MD, and need for this was reemphasized today.    Current Outpatient Prescriptions  Medication Sig Dispense Refill  . aspirin 81 MG tablet Take 81 mg by mouth daily.        Marland Kitchen CHERRY PO Take by mouth daily.        Marland Kitchen lisinopril (PRINIVIL,ZESTRIL) 2.5 MG tablet Take 1 tablet (2.5 mg total) by mouth daily.  30 tablet  11  . metoprolol tartrate (LOPRESSOR) 25 MG tablet Take 25 mg by mouth 2 (two) times daily.        . nitroGLYCERIN (NITROSTAT) 0.4 MG SL tablet Place 0.4 mg under the tongue every 5 (five) minutes as needed.        Marland Kitchen omeprazole (PRILOSEC) 20 MG capsule Take 20 mg by mouth as needed.       . prasugrel (EFFIENT) 10 MG TABS Take 10 mg by mouth daily.        . pravastatin (PRAVACHOL) 20 MG tablet Take 1 tablet (20 mg total) by mouth daily.  30 tablet  6    No Known Allergies  Past Medical History  Diagnosis Date  . CAD (coronary artery disease)     a. s/p Ant. STEMI 03/17/11 tx with Promus DES to LAD;  b. cath 03/17/11: LAD 95% (PCI), then 50%, D2 70%;  c. echo 03/17/11: EF 40-45%, septal, apical, distal Ant AK, mod LVH  . Hyperglycemia     a. A1C 5.8 (4/12)  . Dyslipidemia   . CKD (chronic kidney disease)   . HTN (hypertension)   . Gout   . GERD (gastroesophageal reflux disease)     Past Surgical History  Procedure Date  . Eye surgery     as a child    Family History  Problem Relation Age of Onset  . Coronary artery disease    . Heart attack    . Heart attack Father 98    History   Social History  . Marital Status: Single    Spouse Name: N/A    Number of Children: N/A  . Years of Education: N/A   Occupational History  . Not on file.   Social History Main Topics  . Smoking  status: Never Smoker   . Smokeless tobacco: Not on file  . Alcohol Use: No  . Drug Use: No  . Sexually Active: Not on file   Other Topics Concern  . Not on file   Social History Narrative  . No narrative on file    ROS: Please see the HPI.  All other systems reviewed and negative.  The LFTs were up on pravastatin originally, and recently normalized.  These need to be rechecked again.    PHYSICAL EXAM:  BP 104/71  Pulse 71  Ht 5\' 9"  (1.753 m)  Wt 181 lb (82.101 kg)  BMI 26.73 kg/m2  General: Well developed, well nourished, in no acute distress. Head:  Normocephalic and atraumatic. Neck: no JVD Lungs: Clear to auscultation and percussion. Heart: Normal S1 and S2.  No murmur, rubs or gallops.  Pulses: Pulses normal in all 4 extremities. Extremities: No clubbing or cyanosis. No edema. Neurologic: Alert and oriented  x 3.  EKG:  NSR.  Septal MI, indeterminate age  Borderline low voltage QRS.  ASSESSMENT AND PLAN:

## 2011-06-21 NOTE — Assessment & Plan Note (Signed)
Current pressure is well controlled at present.

## 2011-06-21 NOTE — Assessment & Plan Note (Signed)
Remains stable at the present time.  No chest pain.  HR remained higher during cool down, but he is asymptomatic.  Following up closely in rehab.  Well managed there.

## 2011-06-21 NOTE — Assessment & Plan Note (Addendum)
See Scott's notes.  LFT were high, then normalized.  Recently restarted back on statins at very low dose.  May need to revisit.  Recheck.  Continue to monitor closely.

## 2011-06-22 ENCOUNTER — Encounter (HOSPITAL_COMMUNITY): Payer: Self-pay

## 2011-06-24 ENCOUNTER — Encounter (HOSPITAL_COMMUNITY): Payer: Self-pay

## 2011-06-26 ENCOUNTER — Encounter (HOSPITAL_COMMUNITY): Payer: Self-pay

## 2011-06-29 ENCOUNTER — Encounter (HOSPITAL_COMMUNITY): Payer: Self-pay

## 2011-07-01 ENCOUNTER — Encounter (HOSPITAL_COMMUNITY): Payer: Self-pay

## 2011-07-01 ENCOUNTER — Other Ambulatory Visit: Payer: Self-pay | Admitting: *Deleted

## 2011-07-01 ENCOUNTER — Other Ambulatory Visit (INDEPENDENT_AMBULATORY_CARE_PROVIDER_SITE_OTHER): Payer: Self-pay | Admitting: *Deleted

## 2011-07-01 DIAGNOSIS — E7800 Pure hypercholesterolemia, unspecified: Secondary | ICD-10-CM

## 2011-07-01 DIAGNOSIS — I2510 Atherosclerotic heart disease of native coronary artery without angina pectoris: Secondary | ICD-10-CM

## 2011-07-01 DIAGNOSIS — E785 Hyperlipidemia, unspecified: Secondary | ICD-10-CM

## 2011-07-01 LAB — HEPATIC FUNCTION PANEL
ALT: 30 U/L (ref 0–53)
AST: 30 U/L (ref 0–37)
Albumin: 4.5 g/dL (ref 3.5–5.2)

## 2011-07-03 ENCOUNTER — Encounter (HOSPITAL_COMMUNITY): Payer: Self-pay

## 2011-07-06 ENCOUNTER — Encounter (HOSPITAL_COMMUNITY): Payer: Self-pay

## 2011-07-08 ENCOUNTER — Encounter (HOSPITAL_COMMUNITY): Payer: Self-pay

## 2011-07-10 ENCOUNTER — Encounter (HOSPITAL_COMMUNITY): Payer: Self-pay | Attending: Cardiology

## 2011-07-10 DIAGNOSIS — Z5189 Encounter for other specified aftercare: Secondary | ICD-10-CM | POA: Insufficient documentation

## 2011-07-10 DIAGNOSIS — I2109 ST elevation (STEMI) myocardial infarction involving other coronary artery of anterior wall: Secondary | ICD-10-CM | POA: Insufficient documentation

## 2011-07-10 DIAGNOSIS — Z9861 Coronary angioplasty status: Secondary | ICD-10-CM | POA: Insufficient documentation

## 2011-07-10 DIAGNOSIS — I10 Essential (primary) hypertension: Secondary | ICD-10-CM | POA: Insufficient documentation

## 2011-07-10 DIAGNOSIS — R42 Dizziness and giddiness: Secondary | ICD-10-CM | POA: Insufficient documentation

## 2011-07-10 DIAGNOSIS — I2510 Atherosclerotic heart disease of native coronary artery without angina pectoris: Secondary | ICD-10-CM | POA: Insufficient documentation

## 2011-07-13 ENCOUNTER — Encounter (HOSPITAL_COMMUNITY): Payer: Self-pay

## 2011-07-15 ENCOUNTER — Encounter (HOSPITAL_COMMUNITY): Payer: Self-pay

## 2011-07-17 ENCOUNTER — Encounter (HOSPITAL_COMMUNITY): Payer: Self-pay

## 2011-07-31 ENCOUNTER — Other Ambulatory Visit (INDEPENDENT_AMBULATORY_CARE_PROVIDER_SITE_OTHER): Payer: Self-pay | Admitting: *Deleted

## 2011-07-31 DIAGNOSIS — E7800 Pure hypercholesterolemia, unspecified: Secondary | ICD-10-CM

## 2011-07-31 DIAGNOSIS — I2510 Atherosclerotic heart disease of native coronary artery without angina pectoris: Secondary | ICD-10-CM

## 2011-07-31 LAB — HEPATIC FUNCTION PANEL
ALT: 39 U/L (ref 0–53)
Total Bilirubin: 1.2 mg/dL (ref 0.3–1.2)

## 2011-08-05 ENCOUNTER — Encounter: Payer: Self-pay | Admitting: Cardiology

## 2011-08-05 ENCOUNTER — Ambulatory Visit (INDEPENDENT_AMBULATORY_CARE_PROVIDER_SITE_OTHER): Payer: Self-pay | Admitting: Cardiology

## 2011-08-05 DIAGNOSIS — I10 Essential (primary) hypertension: Secondary | ICD-10-CM

## 2011-08-05 DIAGNOSIS — I2510 Atherosclerotic heart disease of native coronary artery without angina pectoris: Secondary | ICD-10-CM

## 2011-08-05 DIAGNOSIS — E785 Hyperlipidemia, unspecified: Secondary | ICD-10-CM

## 2011-08-05 DIAGNOSIS — Z79899 Other long term (current) drug therapy: Secondary | ICD-10-CM

## 2011-08-05 LAB — BASIC METABOLIC PANEL
CO2: 29 mEq/L (ref 19–32)
GFR: 59.24 mL/min — ABNORMAL LOW (ref >60.00)
Glucose, Bld: 105 mg/dL — ABNORMAL HIGH (ref 70–99)
Potassium: 3.9 mEq/L (ref 3.5–5.1)
Sodium: 138 mEq/L (ref 135–145)

## 2011-08-05 MED ORDER — PRASUGREL HCL 10 MG PO TABS: 10.0000 mg | ORAL_TABLET | Freq: Every day | ORAL | Status: DC

## 2011-08-05 NOTE — Assessment & Plan Note (Signed)
Labs reviewed.  Check BMET.  Had elevation at time of admission but ok in May 2012.

## 2011-08-05 NOTE — Assessment & Plan Note (Signed)
Last LFTs are ok on prava 20.  Will recheck lipid and liver in four months.

## 2011-08-05 NOTE — Patient Instructions (Addendum)
Your physician recommends that you schedule a follow-up appointment in: 4 MONTHS  WITH DR Riley Kill Your physician recommends that you continue on your current medications as directed. Please refer to the Current Medication list given to you today.  Your physician recommends that you return for lab work in: FASTING LIPID LIVER IN 4 MONTHS DX 272.4 V58.69 BMET TODAY  DX V58.69

## 2011-08-05 NOTE — Assessment & Plan Note (Signed)
Stable.  No chest pain.  Outcomes with DES (promus) in AMI reviewed.  Continue effient for now.  Will continue until April 2013.

## 2011-08-05 NOTE — Progress Notes (Signed)
HPI:  Doing well.  No complaints.  Tolerating meds.  Hands ache just a little on pravachol.  Last LFTs are ok.  Does not smoke, and rarely drinks any alcohol.  Feels good.  Works at [employer].  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    Current Outpatient Prescriptions  Medication Sig Dispense Refill  . aspirin 81 MG tablet Take 81 mg by mouth daily.        Marland Kitchen CHERRY PO Take by mouth daily.        Marland Kitchen lisinopril (PRINIVIL,ZESTRIL) 2.5 MG tablet Take 1 tablet (2.5 mg total) by mouth daily.  30 tablet  11  . metoprolol tartrate (LOPRESSOR) 25 MG tablet Take 25 mg by mouth 2 (two) times daily.        . nitroGLYCERIN (NITROSTAT) 0.4 MG SL tablet Place 0.4 mg under the tongue every 5 (five) minutes as needed.        Marland Kitchen omeprazole (PRILOSEC) 20 MG capsule Take 20 mg by mouth as needed.       . prasugrel (EFFIENT) 10 MG TABS Take 10 mg by mouth daily.        . pravastatin (PRAVACHOL) 20 MG tablet Take 1 tablet (20 mg total) by mouth daily.  30 tablet  6    No Known Allergies  Past Medical History  Diagnosis Date  . CAD (coronary artery disease)     a. s/p Ant. STEMI 03/17/11 tx with Promus DES to LAD;  b. cath 03/17/11: LAD 95% (PCI), then 50%, D2 70%;  c. echo 03/17/11: EF 40-45%, septal, apical, distal Ant AK, mod LVH  . Hyperglycemia     a. A1C 5.8 (4/12)  . Dyslipidemia   . CKD (chronic kidney disease)   . HTN (hypertension)   . Gout   . GERD (gastroesophageal reflux disease)     Past Surgical History  Procedure Date  . Eye surgery     as a child    Family History  Problem Relation Age of Onset  . Coronary artery disease    . Heart attack    . Heart attack Father 65    History   Social History  . Marital Status: Single    Spouse Name: N/A    Number of Children: N/A  . Years of Education: N/A   Occupational History  . Not on file.   Social History Main Topics  . Smoking status: Never Smoker   . Smokeless  tobacco: Not on file  . Alcohol Use: No  . Drug Use: No  . Sexually Active: Not on file   Other Topics Concern  . Not on file   Social History Narrative  . No narrative on file    ROS: Please see the HPI.  All other systems reviewed and negative.  PHYSICAL EXAM:  BP 104/72  Pulse 62  Ht 5\' 10"  (1.778 m)  Wt 176 lb 1.9 oz (79.888 kg)  BMI 25.27 kg/m2  General: Well developed, well nourished, in no acute distress. Head:  Normocephalic and atraumatic. Neck: no JVD Lungs: Clear to auscultation and percussion. Heart: Normal S1 and S2.  No murmur, rubs or gallops.  Pulses: Pulses normal in all 4 extremities. Extremities: No clubbing or cyanosis. No edema. Neurologic: Alert and oriented x 3.  EKG:  None done  ASSESSMENT AND PLAN:

## 2011-08-17 ENCOUNTER — Telehealth: Payer: Self-pay | Admitting: Cardiology

## 2011-08-17 NOTE — Telephone Encounter (Signed)
Pt needs to reorder effient, thought it was reordered at last visit but someone called it into pharmacy and he is on the pt assitence program, not sure if it has already been ordered , if any questions call [phone]

## 2011-08-17 NOTE — Telephone Encounter (Signed)
Patient states is enroll in Effient assistance program and got the first 3 months supply. He is going to be out the medication this Sunday. The office needs to reorder a refill. [employer] assistant program was called at [phone].The person I spoke to, will fax a refill  form for [place] to send back. At this time we have not received it . Pt was called to  left him know. A message was left for pt to call back in any questions.

## 2011-08-18 ENCOUNTER — Telehealth: Payer: Self-pay | Admitting: *Deleted

## 2011-08-18 NOTE — Telephone Encounter (Signed)
PT CALLING ABOUT EFFIENT REFILL FROM [facility]--ADVISED I CALLED [facility] AND THEY STATED A REFILL FORM MUST BE SENT EVERY 3 MONTHS FOR EFFIENT--I ADVISED I WILL GET FORM AND HAVE DR Riley Kill SIGN 08/19/11-AND FAX TO [facility]PT AGREES--NT

## 2011-08-18 NOTE — Telephone Encounter (Signed)
LMTCB--nt 

## 2011-08-21 NOTE — Telephone Encounter (Signed)
Left a message to call back.

## 2011-08-25 NOTE — Telephone Encounter (Signed)
Patient returning last week call. Effient 10 mg 14 sample pills placed at the front desk patient aware.

## 2011-09-03 NOTE — Telephone Encounter (Signed)
Effient arrived in the office.  I left a message for the pt that medication was placed at the front desk for pick-up. Order #[id number]

## 2011-11-27 ENCOUNTER — Ambulatory Visit: Payer: Self-pay | Admitting: Cardiology

## 2012-01-13 ENCOUNTER — Encounter: Payer: Self-pay | Admitting: Cardiology

## 2012-01-13 ENCOUNTER — Ambulatory Visit (INDEPENDENT_AMBULATORY_CARE_PROVIDER_SITE_OTHER): Payer: Self-pay | Admitting: Cardiology

## 2012-01-13 VITALS — BP 136/86 | HR 62 | Ht 69.0 in | Wt 185.0 lb

## 2012-01-13 DIAGNOSIS — R739 Hyperglycemia, unspecified: Secondary | ICD-10-CM

## 2012-01-13 DIAGNOSIS — I2510 Atherosclerotic heart disease of native coronary artery without angina pectoris: Secondary | ICD-10-CM

## 2012-01-13 DIAGNOSIS — R7309 Other abnormal glucose: Secondary | ICD-10-CM

## 2012-01-13 DIAGNOSIS — R7989 Other specified abnormal findings of blood chemistry: Secondary | ICD-10-CM | POA: Insufficient documentation

## 2012-01-13 DIAGNOSIS — E785 Hyperlipidemia, unspecified: Secondary | ICD-10-CM

## 2012-01-13 DIAGNOSIS — R945 Abnormal results of liver function studies: Secondary | ICD-10-CM | POA: Insufficient documentation

## 2012-01-13 LAB — BASIC METABOLIC PANEL
CO2: 28 mEq/L (ref 19–32)
Chloride: 103 mEq/L (ref 96–112)
Creatinine, Ser: 1.4 mg/dL (ref 0.4–1.5)

## 2012-01-13 LAB — HEPATIC FUNCTION PANEL
AST: 22 U/L (ref 0–37)
Albumin: 4.1 g/dL (ref 3.5–5.2)
Alkaline Phosphatase: 95 U/L (ref 39–117)

## 2012-01-13 NOTE — Assessment & Plan Note (Signed)
Recheck value. 

## 2012-01-13 NOTE — Progress Notes (Signed)
   HPI:  Doing well.  Still has some aching in hands.  No chest pain.  Clinical situation reviewed.  No current pain.    Current Outpatient Prescriptions  Medication Sig Dispense Refill  . aspirin 81 MG tablet Take 81 mg by mouth daily.        Marland Kitchen CHERRY PO Take by mouth daily.        Marland Kitchen lisinopril (PRINIVIL,ZESTRIL) 2.5 MG tablet Take 1 tablet (2.5 mg total) by mouth daily.  30 tablet  11  . metoprolol tartrate (LOPRESSOR) 25 MG tablet Take 25 mg by mouth 2 (two) times daily.        . nitroGLYCERIN (NITROSTAT) 0.4 MG SL tablet Place 0.4 mg under the tongue every 5 (five) minutes as needed.        Marland Kitchen omeprazole (PRILOSEC) 20 MG capsule Take 20 mg by mouth as needed.       . prasugrel (EFFIENT) 10 MG TABS Take 1 tablet (10 mg total) by mouth daily.  30 tablet  11  . pravastatin (PRAVACHOL) 20 MG tablet Take 1 tablet (20 mg total) by mouth daily.  30 tablet  6    No Known Allergies  Past Medical History  Diagnosis Date  . CAD (coronary artery disease)     a. s/p Ant. STEMI 03/17/11 tx with Promus DES to LAD;  b. cath 03/17/11: LAD 95% (PCI), then 50%, D2 70%;  c. echo 03/17/11: EF 40-45%, septal, apical, distal Ant AK, mod LVH  . Hyperglycemia     a. A1C 5.8 (4/12)  . Dyslipidemia   . CKD (chronic kidney disease)   . HTN (hypertension)   . Gout   . GERD (gastroesophageal reflux disease)     Past Surgical History  Procedure Date  . Eye surgery     as a child    Family History  Problem Relation Age of Onset  . Coronary artery disease    . Heart attack    . Heart attack Father 42    History   Social History  . Marital Status: Single    Spouse Name: N/A    Number of Children: N/A  . Years of Education: N/A   Occupational History  . Not on file.   Social History Main Topics  . Smoking status: Never Smoker   . Smokeless tobacco: Not on file  . Alcohol Use: No  . Drug Use: No  . Sexually Active: Not on file   Other Topics Concern  . Not on file   Social History  Narrative  . No narrative on file    ROS: Please see the HPI.  All other systems reviewed and negative.  PHYSICAL EXAM:  BP 136/86  Pulse 62  Ht 5\' 9"  (1.753 m)  Wt 185 lb (83.915 kg)  BMI 27.32 kg/m2  General: Well developed, well nourished, in no acute distress. Head:  Normocephalic and atraumatic. Neck: no JVD Lungs: Clear to auscultation and percussion. Heart: Normal S1 and S2.  No murmur, rubs or gallops.  Pulses: Pulses normal in all 4 extremities. Extremities: No clubbing or cyanosis. No edema. Neurologic: Alert and oriented x 3.  EKG:  NSR.  Borderline low voltage.   ASSESSMENT AND PLAN:

## 2012-01-13 NOTE — Assessment & Plan Note (Signed)
Recheck A1c 

## 2012-01-13 NOTE — Assessment & Plan Note (Signed)
Mildly abnormal LFT.  Will recheck.  On very low dose pravachol.

## 2012-01-13 NOTE — Patient Instructions (Addendum)
Your physician recommends that you schedule a follow-up appointment in: 2 MONTHS.  Your physician recommends that you continue on your current medications as directed. Please refer to the Current Medication list given to you today.  Your physician recommends that you have lab work today: BMP, LIVER and HgbA1c

## 2012-01-13 NOTE — Assessment & Plan Note (Signed)
Recheck BMET 

## 2012-01-13 NOTE — Assessment & Plan Note (Signed)
NO recurrent chest pain.  Duration of DAPT reviewed.  May be a candidate to consider Pegasus.

## 2012-01-18 IMAGING — US US ABDOMEN COMPLETE
2 series · 14 of 25 positions shown · non-contrast
Comparison: None.

CLINICAL DATA: Elevated LFTs, abdominal pain

ABDOMINAL ULTRASOUND COMPLETE

[Series 1: us abdomen complete · 0.31mm/px · 13 of 62 slices shown (1 of 2)]
[im 1/62]
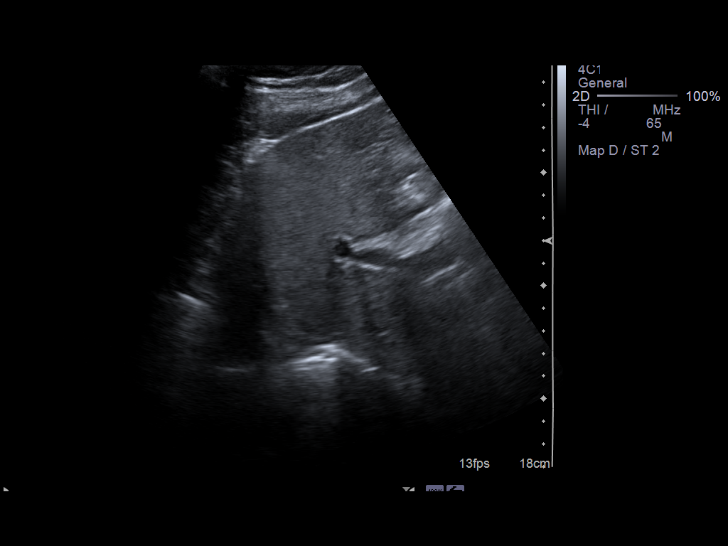
[im 6/62]
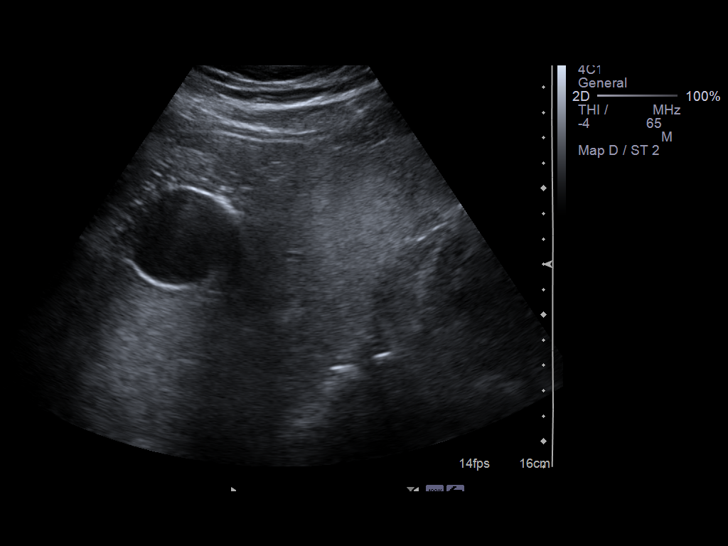
[im 12/62]
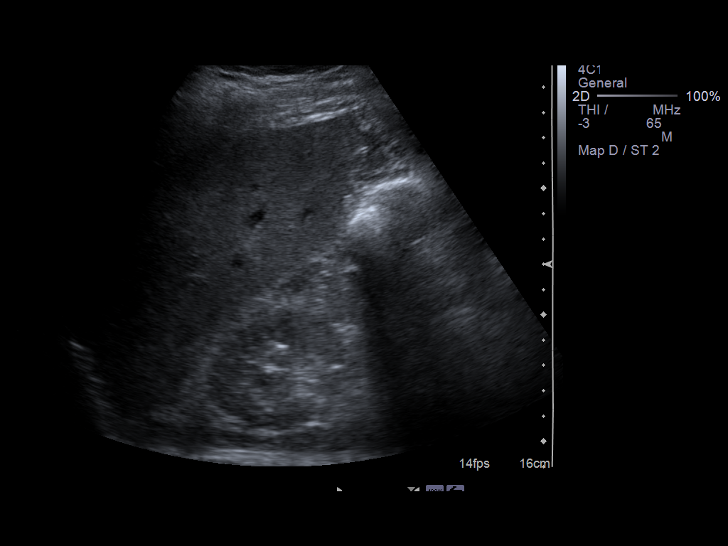
[im 17/62]
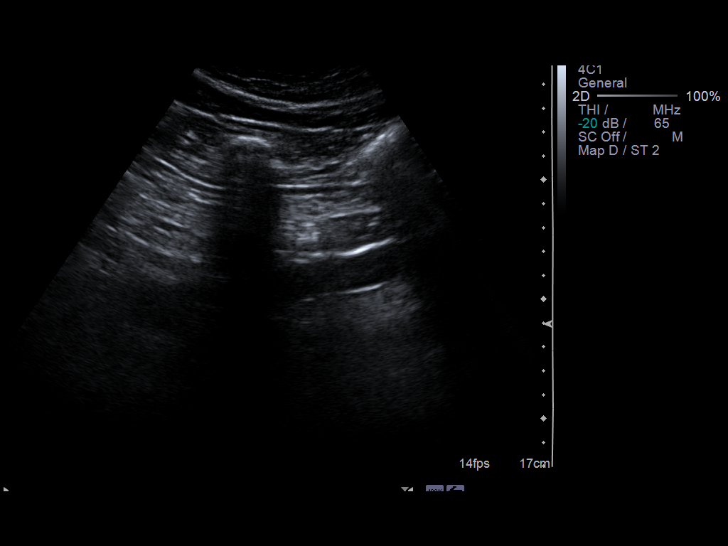
[im 23/62]
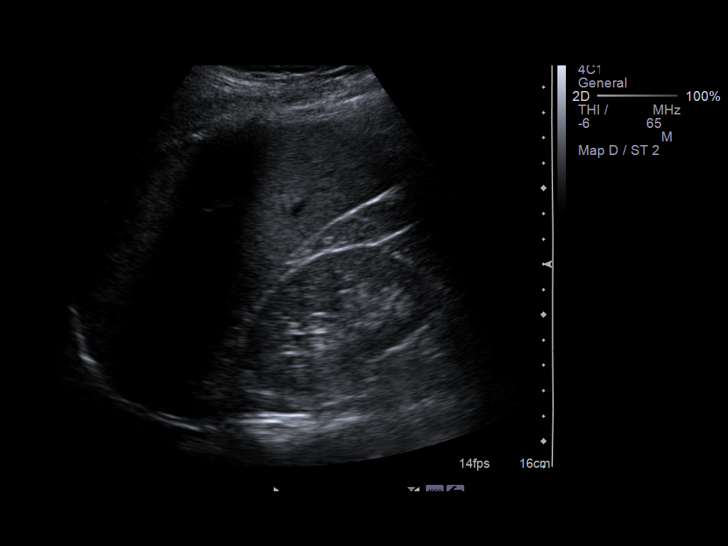
[im 25/62]
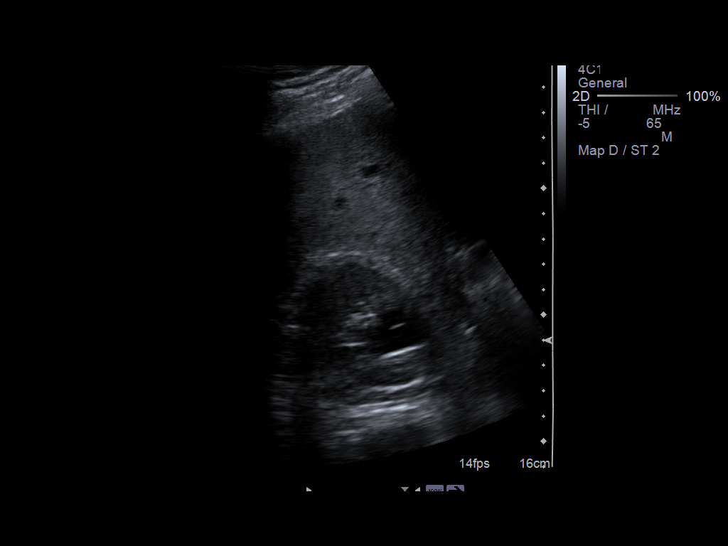
[im 31/62]
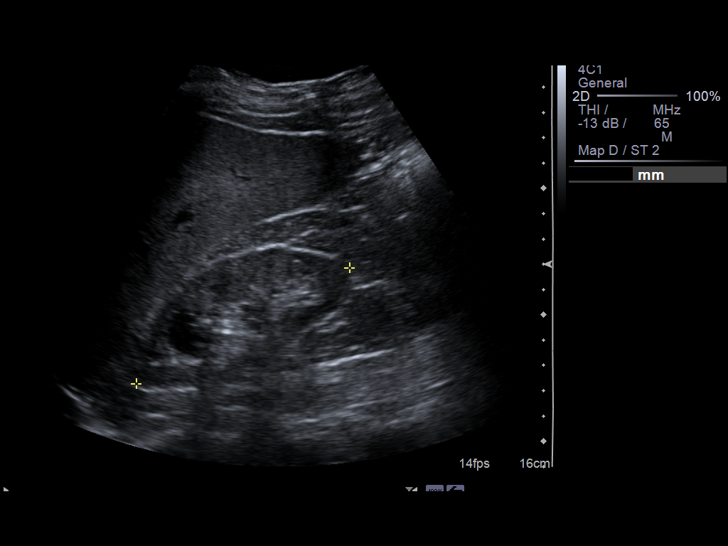
[im 37/62]
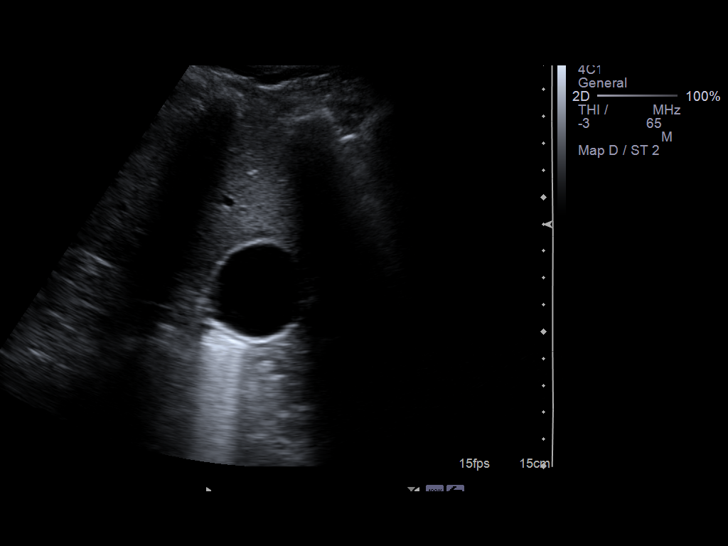
[im 42/62]
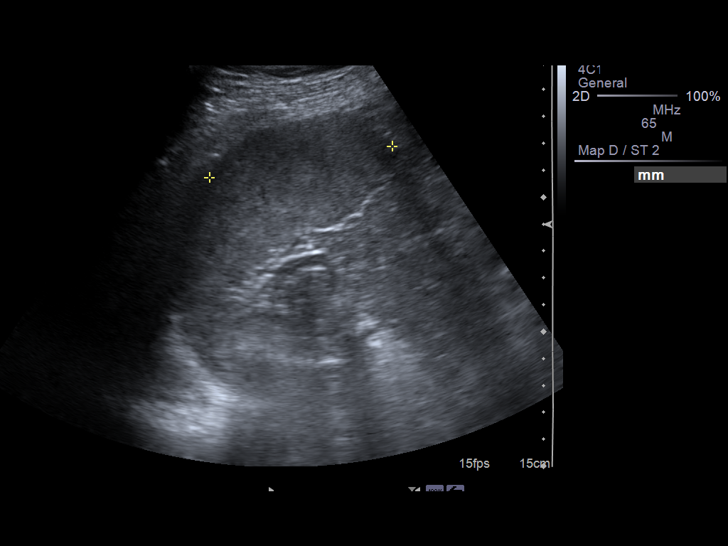
[im 45/62]
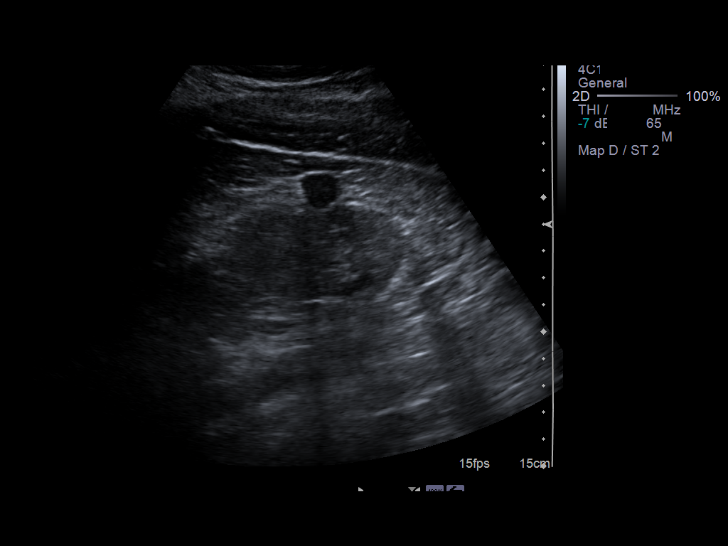
[im 50/62]
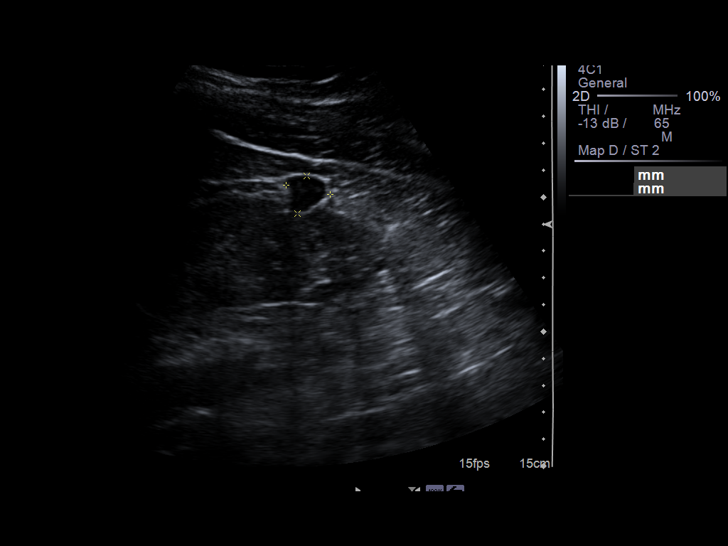
[im 56/62]
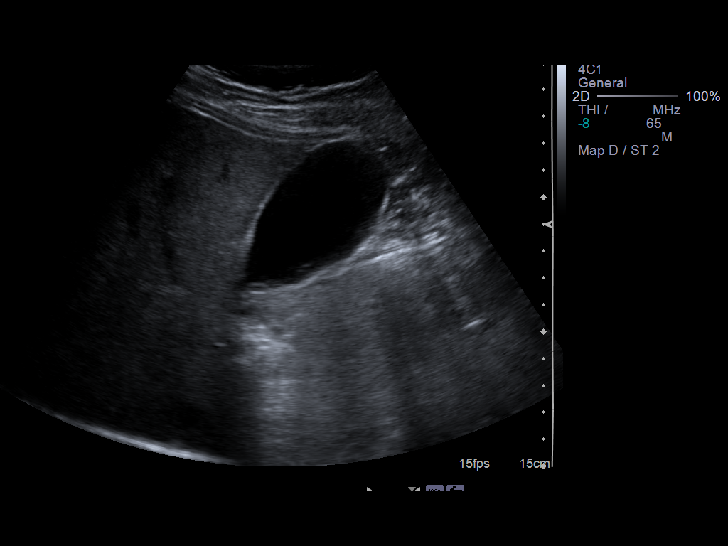
[im 62/62]
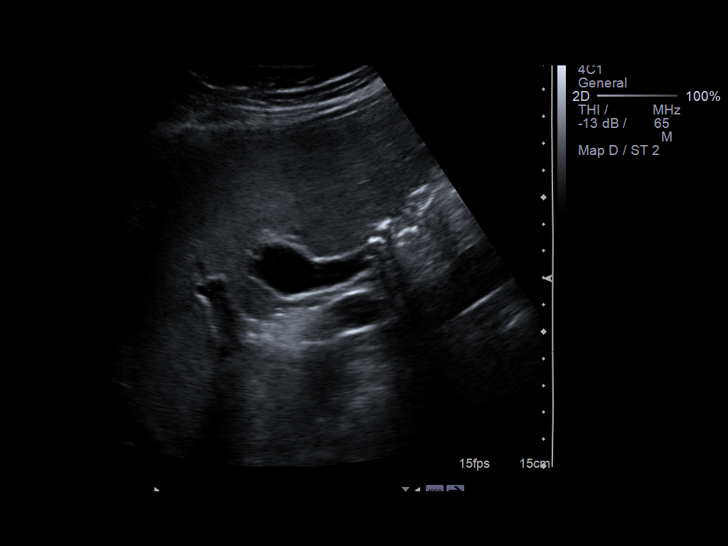

[Series 2: us abdomen complete · 0.25mm/px · 1 of 5 slices shown (2 of 2)]
[im 5/5]
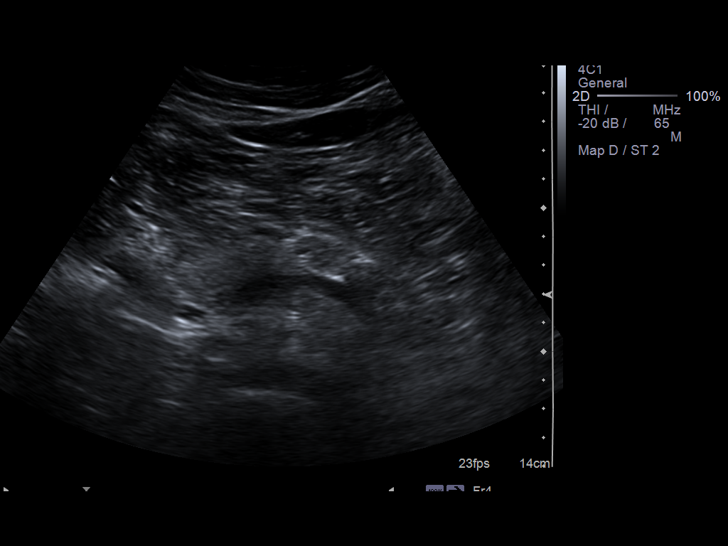

[14 of 25 positions shown; findings below may reference images not displayed]

FINDINGS: Gallbladder:  No gallstones, gallbladder wall thickening, or
pericholecystic fluid.

Common Bile Duct:  Within normal limits in caliber.

Liver: No focal mass lesion identified.  Within normal limits in
parenchymal echogenicity.

IVC:  Appears normal.

Pancreas:  Limited visualization, no gross acute abnormality.

Spleen:  Slight heterogeneous echotexture, nonspecific.  7 cm
length.  No definite focal abnormality.

Right kidney:  9.6 cm length.  No hydronephrosis.  Upper pole
hypoechoic minimally complex septated cyst noted measuring 2.2 cm
in greatest diameter.

Left kidney:  9.8 cm length.  No hydronephrosis or focal
abnormality.  2 small hypoechoic cyst, largest in the lower pole
measuring 1.7 cm in greatest diameter.

Abdominal Aorta:  No aneurysm identified.
IMPRESSION: No acute finding by abdominal ultrasound.  Normal gallbladder.

Incidental renal cysts.

## 2012-01-20 ENCOUNTER — Other Ambulatory Visit: Payer: Self-pay

## 2012-01-20 DIAGNOSIS — E876 Hypokalemia: Secondary | ICD-10-CM

## 2012-01-21 ENCOUNTER — Telehealth: Payer: Self-pay

## 2012-01-21 NOTE — Telephone Encounter (Signed)
I left a message to let the pt know that his Effient had arrived in the office and was placed at the front desk for pick-up. Order #[id number]

## 2012-01-27 ENCOUNTER — Other Ambulatory Visit (INDEPENDENT_AMBULATORY_CARE_PROVIDER_SITE_OTHER): Payer: Self-pay

## 2012-01-27 DIAGNOSIS — E785 Hyperlipidemia, unspecified: Secondary | ICD-10-CM

## 2012-01-27 DIAGNOSIS — E876 Hypokalemia: Secondary | ICD-10-CM

## 2012-01-28 LAB — BASIC METABOLIC PANEL
CO2: 31 mEq/L (ref 19–32)
Calcium: 9.3 mg/dL (ref 8.4–10.5)
Creatinine, Ser: 1.4 mg/dL (ref 0.4–1.5)

## 2012-01-28 LAB — HEPATIC FUNCTION PANEL
ALT: 17 U/L (ref 0–53)
Bilirubin, Direct: 0.2 mg/dL (ref 0.0–0.3)

## 2012-03-14 ENCOUNTER — Ambulatory Visit (INDEPENDENT_AMBULATORY_CARE_PROVIDER_SITE_OTHER): Payer: Self-pay | Admitting: Cardiology

## 2012-03-14 ENCOUNTER — Encounter: Payer: Self-pay | Admitting: Cardiology

## 2012-03-14 VITALS — BP 130/90 | HR 71 | Ht 69.0 in | Wt 186.0 lb

## 2012-03-14 DIAGNOSIS — K219 Gastro-esophageal reflux disease without esophagitis: Secondary | ICD-10-CM

## 2012-03-14 DIAGNOSIS — E785 Hyperlipidemia, unspecified: Secondary | ICD-10-CM

## 2012-03-14 DIAGNOSIS — R7989 Other specified abnormal findings of blood chemistry: Secondary | ICD-10-CM

## 2012-03-14 DIAGNOSIS — I10 Essential (primary) hypertension: Secondary | ICD-10-CM

## 2012-03-14 DIAGNOSIS — I2510 Atherosclerotic heart disease of native coronary artery without angina pectoris: Secondary | ICD-10-CM

## 2012-03-14 DIAGNOSIS — R945 Abnormal results of liver function studies: Secondary | ICD-10-CM

## 2012-03-14 LAB — BASIC METABOLIC PANEL
BUN: 18 mg/dL (ref 6–23)
CO2: 31 mEq/L (ref 19–32)
Calcium: 11 mg/dL — ABNORMAL HIGH (ref 8.4–10.5)
Chloride: 97 mEq/L (ref 96–112)
Creatinine, Ser: 1.5 mg/dL (ref 0.4–1.5)
Glucose, Bld: 95 mg/dL (ref 70–99)

## 2012-03-14 LAB — LIPID PANEL
Cholesterol: 203 mg/dL — ABNORMAL HIGH (ref 0–200)
Total CHOL/HDL Ratio: 6
VLDL: 35.6 mg/dL (ref 0.0–40.0)

## 2012-03-14 LAB — HEPATIC FUNCTION PANEL
AST: 42 U/L — ABNORMAL HIGH (ref 0–37)
Albumin: 4.7 g/dL (ref 3.5–5.2)
Alkaline Phosphatase: 124 U/L — ABNORMAL HIGH (ref 39–117)
Bilirubin, Direct: 0 mg/dL (ref 0.0–0.3)

## 2012-03-14 MED ORDER — METOPROLOL TARTRATE 25 MG PO TABS: 25.0000 mg | ORAL_TABLET | Freq: Two times a day (BID) | ORAL | Status: DC

## 2012-03-14 MED ORDER — CLOPIDOGREL BISULFATE 75 MG PO TABS: 75.0000 mg | ORAL_TABLET | Freq: Every day | ORAL | Status: DC

## 2012-03-14 MED ORDER — LISINOPRIL 2.5 MG PO TABS: 2.5000 mg | ORAL_TABLET | Freq: Every day | ORAL | Status: DC

## 2012-03-14 NOTE — Assessment & Plan Note (Signed)
Recheck.  Never all that bad.  Bili increase---? Inherited disorder.

## 2012-03-14 NOTE — Assessment & Plan Note (Signed)
Stopped statins---made him ache.  Was on low dose prava.  Might be candidate for Crestor 5mg  twice weekly.

## 2012-03-14 NOTE — Assessment & Plan Note (Signed)
Replace Prilosec with pepcid.

## 2012-03-14 NOTE — Progress Notes (Signed)
   HPI:  He is in for follow up.  Overall doing pretty well.  Denies any chest pain.  He gets along without trouble.  He did stop his pravachol because (even at low dose) it made him ache a lot.  He denies progressive symptoms.  He also had abnormal LFTs at one point.  Currently feels pretty good.    Current Outpatient Prescriptions  Medication Sig Dispense Refill  . aspirin 81 MG tablet Take 81 mg by mouth daily.        Marland Kitchen CHERRY PO Take by mouth daily.        Marland Kitchen lisinopril (PRINIVIL,ZESTRIL) 2.5 MG tablet Take 1 tablet (2.5 mg total) by mouth daily.  30 tablet  11  . metoprolol tartrate (LOPRESSOR) 25 MG tablet Take 25 mg by mouth 2 (two) times daily.        . nitroGLYCERIN (NITROSTAT) 0.4 MG SL tablet Place 0.4 mg under the tongue every 5 (five) minutes as needed.        Marland Kitchen omeprazole (PRILOSEC) 20 MG capsule Take 20 mg by mouth as needed.         No Known Allergies  Past Medical History  Diagnosis Date  . CAD (coronary artery disease)     a. s/p Ant. STEMI 03/17/11 tx with Promus DES to LAD;  b. cath 03/17/11: LAD 95% (PCI), then 50%, D2 70%;  c. echo 03/17/11: EF 40-45%, septal, apical, distal Ant AK, mod LVH  . Hyperglycemia     a. A1C 5.8 (4/12)  . Dyslipidemia   . CKD (chronic kidney disease)   . HTN (hypertension)   . Gout   . GERD (gastroesophageal reflux disease)     Past Surgical History  Procedure Date  . Eye surgery     as a child    Family History  Problem Relation Age of Onset  . Coronary artery disease    . Heart attack    . Heart attack Father 40    History   Social History  . Marital Status: Single    Spouse Name: N/A    Number of Children: N/A  . Years of Education: N/A   Occupational History  . Not on file.   Social History Main Topics  . Smoking status: Never Smoker   . Smokeless tobacco: Not on file  . Alcohol Use: No  . Drug Use: No  . Sexually Active: Not on file   Other Topics Concern  . Not on file   Social History Narrative  . No  narrative on file    ROS: Please see the HPI.  All other systems reviewed and negative.  PHYSICAL EXAM:  BP 130/90  Pulse 71  Ht 5\' 9"  (1.753 m)  Wt 186 lb (84.369 kg)  BMI 27.47 kg/m2  General: Well developed, well nourished, in no acute distress. Head:  Normocephalic and atraumatic. Neck: no JVD Lungs: Clear to auscultation and percussion. Heart: Normal S1 and S2.  No murmur, rubs or gallops.  Abdomen:  Normal bowel sounds; soft; non tender; no organomegaly Pulses: Pulses normal in all 4 extremities. Extremities: No clubbing or cyanosis. No edema. Neurologic: Alert and oriented x 3.  EKG:  NSR.  Anterior MI, indeterminate age.   ASSESSMENT AND PLAN:

## 2012-03-14 NOTE — Assessment & Plan Note (Addendum)
Not well controlled.  Will favor BP cuff so he can bring [place] some data points.  If we increase ACE, will need to watch Cr.

## 2012-03-14 NOTE — Patient Instructions (Addendum)
Your physician has recommended you make the following change in your medication: STOP Effient (prasugrel), START Plavix (clopidogrel) 75mg  take one by mouth daily, STOP Omeprazole (Prilosec), USE PEPCID as needed for reflux  Your physician recommends that you have a FASTING LIPID, LIVER and BMP today.  Your physician has requested that you have an echocardiogram. Echocardiography is a painless test that uses sound waves to create images of your heart. It provides your doctor with information about the size and shape of your heart and how well your heart's chambers and valves are working. This procedure takes approximately one hour. There are no restrictions for this procedure.  Your physician recommends that you schedule a follow-up appointment in: 6 WEEKS with Dr Riley Kill  Your physician has requested that you regularly monitor and record your blood pressure readings at home. Please use the same machine at the same time of day to check your readings and record them to bring to your follow-up visit.

## 2012-03-14 NOTE — Assessment & Plan Note (Signed)
Appropriate time to recheck LV function as he was on the borderline.  Will get echo to assess.  Also, will stop Effient now at a year.  Will replace with clopidogrel and since he uses omeprazole only prn, change that to pepcid.  Of note, he had stent placement  (Second generation) for STEMI.  Will see in six weeks to review.

## 2012-03-15 LAB — LDL CHOLESTEROL, DIRECT: Direct LDL: 148.8 mg/dL

## 2012-03-24 ENCOUNTER — Other Ambulatory Visit: Payer: Self-pay

## 2012-03-24 ENCOUNTER — Ambulatory Visit (HOSPITAL_COMMUNITY): Payer: Self-pay | Attending: Cardiology

## 2012-03-24 DIAGNOSIS — I2510 Atherosclerotic heart disease of native coronary artery without angina pectoris: Secondary | ICD-10-CM | POA: Insufficient documentation

## 2012-03-24 DIAGNOSIS — N189 Chronic kidney disease, unspecified: Secondary | ICD-10-CM | POA: Insufficient documentation

## 2012-03-24 DIAGNOSIS — I10 Essential (primary) hypertension: Secondary | ICD-10-CM

## 2012-03-24 DIAGNOSIS — I219 Acute myocardial infarction, unspecified: Secondary | ICD-10-CM | POA: Insufficient documentation

## 2012-03-24 DIAGNOSIS — I129 Hypertensive chronic kidney disease with stage 1 through stage 4 chronic kidney disease, or unspecified chronic kidney disease: Secondary | ICD-10-CM | POA: Insufficient documentation

## 2012-03-24 DIAGNOSIS — E785 Hyperlipidemia, unspecified: Secondary | ICD-10-CM | POA: Insufficient documentation

## 2012-03-24 DIAGNOSIS — R945 Abnormal results of liver function studies: Secondary | ICD-10-CM

## 2012-04-22 ENCOUNTER — Encounter: Payer: Self-pay | Admitting: Cardiology

## 2012-04-22 ENCOUNTER — Ambulatory Visit (INDEPENDENT_AMBULATORY_CARE_PROVIDER_SITE_OTHER): Payer: Self-pay | Admitting: Cardiology

## 2012-04-22 VITALS — BP 120/84 | HR 60 | Ht 70.0 in | Wt 183.8 lb

## 2012-04-22 DIAGNOSIS — I2510 Atherosclerotic heart disease of native coronary artery without angina pectoris: Secondary | ICD-10-CM

## 2012-04-22 DIAGNOSIS — R945 Abnormal results of liver function studies: Secondary | ICD-10-CM

## 2012-04-22 DIAGNOSIS — E785 Hyperlipidemia, unspecified: Secondary | ICD-10-CM

## 2012-04-22 DIAGNOSIS — R7989 Other specified abnormal findings of blood chemistry: Secondary | ICD-10-CM

## 2012-04-22 DIAGNOSIS — I10 Essential (primary) hypertension: Secondary | ICD-10-CM

## 2012-04-22 MED ORDER — NITROGLYCERIN 0.4 MG SL SUBL: 0.4000 mg | SUBLINGUAL_TABLET | SUBLINGUAL | Status: DC | PRN

## 2012-04-22 NOTE — Assessment & Plan Note (Signed)
Need to be rechecked?

## 2012-04-22 NOTE — Progress Notes (Signed)
   HPI:  He is doing really well.  No specific complaints.  He and I reviewed his medications, and also I showed him his echocardiogram.  His EF has improved fro 45 to 55% since it was last checked.    Current Outpatient Prescriptions  Medication Sig Dispense Refill  . aspirin 81 MG tablet Take 81 mg by mouth daily.        Marland Kitchen CHERRY PO Take by mouth daily.        . clopidogrel (PLAVIX) 75 MG tablet Take 1 tablet (75 mg total) by mouth daily.  30 tablet  11  . famotidine (PEPCID AC) 10 MG tablet Take 1 tablet (10 mg total) by mouth as needed for heartburn.      Marland Kitchen lisinopril (PRINIVIL,ZESTRIL) 2.5 MG tablet Take 1 tablet (2.5 mg total) by mouth daily.  30 tablet  11  . metoprolol tartrate (LOPRESSOR) 25 MG tablet Take 1 tablet (25 mg total) by mouth 2 (two) times daily.  60 tablet  11  . nitroGLYCERIN (NITROSTAT) 0.4 MG SL tablet Place 0.4 mg under the tongue every 5 (five) minutes as needed.          No Known Allergies  Past Medical History  Diagnosis Date  . CAD (coronary artery disease)     a. s/p Ant. STEMI 03/17/11 tx with Promus DES to LAD;  b. cath 03/17/11: LAD 95% (PCI), then 50%, D2 70%;  c. echo 03/17/11: EF 40-45%, septal, apical, distal Ant AK, mod LVH  . Hyperglycemia     a. A1C 5.8 (4/12)  . Dyslipidemia   . CKD (chronic kidney disease)   . HTN (hypertension)   . Gout   . GERD (gastroesophageal reflux disease)     Past Surgical History  Procedure Date  . Eye surgery     as a child    Family History  Problem Relation Age of Onset  . Coronary artery disease    . Heart attack    . Heart attack Father 25    History   Social History  . Marital Status: Single    Spouse Name: N/A    Number of Children: N/A  . Years of Education: N/A   Occupational History  . Not on file.   Social History Main Topics  . Smoking status: Never Smoker   . Smokeless tobacco: Not on file  . Alcohol Use: No  . Drug Use: No  . Sexually Active: Not on file   Other Topics Concern    . Not on file   Social History Narrative  . No narrative on file    ROS: Please see the HPI.  All other systems reviewed and negative.  PHYSICAL EXAM:  BP 120/84  Pulse 60  Ht 5\' 10"  (1.778 m)  Wt 183 lb 12.8 oz (83.371 kg)  BMI 26.37 kg/m2  General: Well developed, well nourished, in no acute distress. Head:  Normocephalic and atraumatic. Neck: no JVD Lungs: Clear to auscultation and percussion. Heart: Normal S1 and S2.  No murmur, rubs or gallops.  Pulses: Pulses normal in all 4 extremities. Extremities: No clubbing or cyanosis. No edema. Neurologic: Alert and oriented x 3.  EKG:  ECHO Study Conclusions  Left ventricle: The cavity size was normal. Wall thickness was normal. The estimated ejection fraction was 55%. Wall motion was normal; there were no regional wall motion abnormalities.     ASSESSMENT AND PLAN:

## 2012-04-22 NOTE — Patient Instructions (Signed)
Your physician wants you to follow-up in: 6 MONTHS. You will receive a reminder letter in the mail two months in advance. If you don't receive a letter, please call our office to schedule the follow-up appointment.  Your physician recommends that you continue on your current medications as directed. Please refer to the Current Medication list given to you today.  Your physician recommends that you return for a FASTING LIPID, LIVER and BMP in 2 WEEKS---nothing to eat or drink after midnight, lab opens at 8:30.

## 2012-04-22 NOTE — Assessment & Plan Note (Signed)
EF is improved.  Remains on DAPT, but just plavix now.  Continue for now.  Consider stopping at total of two years.

## 2012-04-22 NOTE — Assessment & Plan Note (Signed)
Controlled at present.  

## 2012-04-22 NOTE — Assessment & Plan Note (Addendum)
Now off statins.  LFT were elevated.  Will need to recheck labs and liver studies.

## 2012-05-10 ENCOUNTER — Other Ambulatory Visit (INDEPENDENT_AMBULATORY_CARE_PROVIDER_SITE_OTHER): Payer: Self-pay

## 2012-05-10 DIAGNOSIS — R945 Abnormal results of liver function studies: Secondary | ICD-10-CM

## 2012-05-10 DIAGNOSIS — E785 Hyperlipidemia, unspecified: Secondary | ICD-10-CM

## 2012-05-10 DIAGNOSIS — I2510 Atherosclerotic heart disease of native coronary artery without angina pectoris: Secondary | ICD-10-CM

## 2012-05-10 DIAGNOSIS — I10 Essential (primary) hypertension: Secondary | ICD-10-CM

## 2012-05-10 DIAGNOSIS — R7989 Other specified abnormal findings of blood chemistry: Secondary | ICD-10-CM

## 2012-05-10 LAB — LIPID PANEL
Cholesterol: 179 mg/dL (ref 0–200)
LDL Cholesterol: 120 mg/dL — ABNORMAL HIGH (ref 0–99)
Triglycerides: 108 mg/dL (ref 0.0–149.0)
VLDL: 21.6 mg/dL (ref 0.0–40.0)

## 2012-05-10 LAB — BASIC METABOLIC PANEL
BUN: 12 mg/dL (ref 6–23)
GFR: 64.34 mL/min (ref >60.00)
Potassium: 4.5 mEq/L (ref 3.5–5.1)
Sodium: 147 mEq/L — ABNORMAL HIGH (ref 135–145)

## 2012-05-10 LAB — HEPATIC FUNCTION PANEL
AST: 46 U/L — ABNORMAL HIGH (ref 0–37)
Total Bilirubin: 1.9 mg/dL — ABNORMAL HIGH (ref 0.3–1.2)

## 2012-05-24 ENCOUNTER — Other Ambulatory Visit: Payer: Self-pay

## 2012-05-24 ENCOUNTER — Encounter: Payer: Self-pay | Admitting: Internal Medicine

## 2012-06-13 ENCOUNTER — Ambulatory Visit: Payer: Self-pay | Admitting: Internal Medicine

## 2012-09-26 ENCOUNTER — Telehealth: Payer: Self-pay | Admitting: Cardiology

## 2012-09-26 DIAGNOSIS — I2510 Atherosclerotic heart disease of native coronary artery without angina pectoris: Secondary | ICD-10-CM

## 2012-09-26 DIAGNOSIS — I10 Essential (primary) hypertension: Secondary | ICD-10-CM

## 2012-09-26 DIAGNOSIS — E785 Hyperlipidemia, unspecified: Secondary | ICD-10-CM

## 2012-09-26 DIAGNOSIS — R945 Abnormal results of liver function studies: Secondary | ICD-10-CM

## 2012-09-26 NOTE — Telephone Encounter (Signed)
New Problem:    Patient called in needing a refill of his clopidogrel (PLAVIX) 75 MG tablet filled at the [facility] on [address] in [place].  This is the pharmacy that he would like to have this medication always filled at.

## 2012-09-27 MED ORDER — CLOPIDOGREL BISULFATE 75 MG PO TABS: 75.0000 mg | ORAL_TABLET | Freq: Every day | ORAL | Status: DC

## 2012-09-27 NOTE — Telephone Encounter (Signed)
rx sent into pharmacy. Lmom, ctuck

## 2012-10-19 ENCOUNTER — Ambulatory Visit (INDEPENDENT_AMBULATORY_CARE_PROVIDER_SITE_OTHER): Payer: Self-pay | Admitting: Cardiology

## 2012-10-19 ENCOUNTER — Encounter: Payer: Self-pay | Admitting: Cardiology

## 2012-10-19 VITALS — BP 132/82 | HR 63 | Wt 192.0 lb

## 2012-10-19 DIAGNOSIS — E785 Hyperlipidemia, unspecified: Secondary | ICD-10-CM

## 2012-10-19 DIAGNOSIS — I2510 Atherosclerotic heart disease of native coronary artery without angina pectoris: Secondary | ICD-10-CM

## 2012-10-19 DIAGNOSIS — R7989 Other specified abnormal findings of blood chemistry: Secondary | ICD-10-CM

## 2012-10-19 DIAGNOSIS — I10 Essential (primary) hypertension: Secondary | ICD-10-CM

## 2012-10-19 DIAGNOSIS — R945 Abnormal results of liver function studies: Secondary | ICD-10-CM

## 2012-10-19 DIAGNOSIS — N189 Chronic kidney disease, unspecified: Secondary | ICD-10-CM

## 2012-10-19 LAB — HEPATIC FUNCTION PANEL
AST: 34 U/L (ref 0–37)
Albumin: 4.1 g/dL (ref 3.5–5.2)
Alkaline Phosphatase: 90 U/L (ref 39–117)
Total Protein: 7.4 g/dL (ref 6.0–8.3)

## 2012-10-19 LAB — BASIC METABOLIC PANEL
Calcium: 9.2 mg/dL (ref 8.4–10.5)
Creatinine, Ser: 1.5 mg/dL (ref 0.4–1.5)
Sodium: 136 mEq/L (ref 135–145)

## 2012-10-19 LAB — LIPID PANEL
Cholesterol: 189 mg/dL (ref 0–200)
HDL: 30.5 mg/dL — ABNORMAL LOW (ref >39.00)
LDL Cholesterol: 130 mg/dL — ABNORMAL HIGH (ref 0–99)
Total CHOL/HDL Ratio: 6
Triglycerides: 141 mg/dL (ref 0.0–149.0)

## 2012-10-19 MED ORDER — METOPROLOL TARTRATE 25 MG PO TABS: 25.0000 mg | ORAL_TABLET | Freq: Two times a day (BID) | ORAL | Status: AC

## 2012-10-19 MED ORDER — LISINOPRIL 2.5 MG PO TABS: 2.5000 mg | ORAL_TABLET | Freq: Every day | ORAL | Status: DC

## 2012-10-19 NOTE — Assessment & Plan Note (Signed)
We will recheck renal function profile today.

## 2012-10-19 NOTE — Progress Notes (Signed)
HPI:   The patient remains stable at the present time. He's not been having any major problems. Approximately today about his liver abnormalities. We plan to recheck his liver function studies at the present time. He denies any chest pain or injection exertional symptoms. He is out more than a year from his primary PCI, and at that time had a stent placed left anterior descending artery. He is currently not on a statin.  Current Outpatient Prescriptions  Medication Sig Dispense Refill  . aspirin 81 MG tablet Take 81 mg by mouth daily.        Marland Kitchen CHERRY PO Take by mouth daily.        . clopidogrel (PLAVIX) 75 MG tablet Take 1 tablet (75 mg total) by mouth daily.  30 tablet  11  . famotidine (PEPCID AC) 10 MG tablet Take 1 tablet (10 mg total) by mouth as needed for heartburn.      Marland Kitchen lisinopril (PRINIVIL,ZESTRIL) 2.5 MG tablet Take 1 tablet (2.5 mg total) by mouth daily.  30 tablet  11  . metoprolol tartrate (LOPRESSOR) 25 MG tablet Take 1 tablet (25 mg total) by mouth 2 (two) times daily.  60 tablet  11  . nitroGLYCERIN (NITROSTAT) 0.4 MG SL tablet Place 1 tablet (0.4 mg total) under the tongue every 5 (five) minutes as needed.  25 tablet  2  . [DISCONTINUED] lisinopril (PRINIVIL,ZESTRIL) 2.5 MG tablet Take 1 tablet (2.5 mg total) by mouth daily.  30 tablet  11  . [DISCONTINUED] metoprolol tartrate (LOPRESSOR) 25 MG tablet Take 1 tablet (25 mg total) by mouth 2 (two) times daily.  60 tablet  11    No Known Allergies  Past Medical History  Diagnosis Date  . CAD (coronary artery disease)     a. s/p Ant. STEMI 03/17/11 tx with Promus DES to LAD;  b. cath 03/17/11: LAD 95% (PCI), then 50%, D2 70%;  c. echo 03/17/11: EF 40-45%, septal, apical, distal Ant AK, mod LVH  . Hyperglycemia     a. A1C 5.8 (4/12)  . Dyslipidemia   . CKD (chronic kidney disease)   . HTN (hypertension)   . Gout   . GERD (gastroesophageal reflux disease)     Past Surgical History  Procedure Date  . Eye surgery     as a  child    Family History  Problem Relation Age of Onset  . Coronary artery disease    . Heart attack    . Heart attack Father 49    History   Social History  . Marital Status: Single    Spouse Name: N/A    Number of Children: N/A  . Years of Education: N/A   Occupational History  . Not on file.   Social History Main Topics  . Smoking status: Never Smoker   . Smokeless tobacco: Not on file  . Alcohol Use: No  . Drug Use: No  . Sexually Active: Not on file   Other Topics Concern  . Not on file   Social History Narrative  . No narrative on file    ROS: Please see the HPI.  All other systems reviewed and negative.  PHYSICAL EXAM:  BP 132/82  Pulse 63  Wt 192 lb (87.091 kg)  General: Well developed, well nourished, in no acute distress. Head:  Normocephalic and atraumatic. Neck: no JVD Lungs: Clear to auscultation and percussion. Heart: Normal S1 and S2.  No murmur, rubs or gallops.  Abdomen:  Normal bowel  sounds; soft; non tender; no organomegaly Pulses: Pulses normal in all 4 extremities. Extremities: No clubbing or cyanosis. No edema. Neurologic: Alert and oriented x 3.  EKG:  NSR.  Low voltage QRS.  No acute changes.    ASSESSMENT AND PLAN:

## 2012-10-19 NOTE — Assessment & Plan Note (Signed)
He remains stable at the present time. I will see him back in followup approximately 3 months, at that time we will consider discontinuation of clopidogrel.

## 2012-10-19 NOTE — Patient Instructions (Addendum)
Your physician recommends that you have lab work today: BMP, Lipid and Liver  Your physician recommends that you schedule a follow-up appointment in: February 2014  Your physician recommends that you continue on your current medications as directed. Please refer to the Current Medication list given to you today.

## 2012-10-19 NOTE — Assessment & Plan Note (Addendum)
We will repeat his liver function studies at the present time. We will schedule lipid profile. His liver functions remain abnormal, and he will be referred for further liver evaluation. I discussed the possibility of further evaluation with the patient, and most recently his liver functions were improved from being abnormal previously. He is been off continuous statin therapy for some time.

## 2013-01-18 ENCOUNTER — Ambulatory Visit: Payer: Self-pay | Admitting: Cardiology

## 2013-01-25 ENCOUNTER — Encounter: Payer: Self-pay | Admitting: Cardiology

## 2013-01-25 ENCOUNTER — Ambulatory Visit (INDEPENDENT_AMBULATORY_CARE_PROVIDER_SITE_OTHER): Payer: Self-pay | Admitting: Cardiology

## 2013-01-25 VITALS — BP 120/88 | HR 65 | Ht 68.0 in | Wt 190.0 lb

## 2013-01-25 DIAGNOSIS — E785 Hyperlipidemia, unspecified: Secondary | ICD-10-CM

## 2013-01-25 DIAGNOSIS — I10 Essential (primary) hypertension: Secondary | ICD-10-CM

## 2013-01-25 DIAGNOSIS — I2510 Atherosclerotic heart disease of native coronary artery without angina pectoris: Secondary | ICD-10-CM

## 2013-01-25 DIAGNOSIS — R945 Abnormal results of liver function studies: Secondary | ICD-10-CM

## 2013-01-25 DIAGNOSIS — R7989 Other specified abnormal findings of blood chemistry: Secondary | ICD-10-CM

## 2013-01-25 LAB — HEPATIC FUNCTION PANEL
Alkaline Phosphatase: 91 U/L (ref 39–117)
Bilirubin, Direct: 0 mg/dL (ref 0.0–0.3)
Total Bilirubin: 1.6 mg/dL — ABNORMAL HIGH (ref 0.3–1.2)

## 2013-01-25 NOTE — Patient Instructions (Addendum)
Discontinue ( stop) Plavix  Continue taking your aspirin daily  You will need lab work today: Liver profile.  We will call you with your results  Follow-up with Dr. Earney Hamburg in 3 months

## 2013-01-25 NOTE — Progress Notes (Signed)
HPI:  This nice patient returns today in a followup visit. Clinically he continues to get along well. We had an extensive discussion regarding the benefits and cons of continued dual antiplatelet therapy, and some of the issues that surround that in this clinical situation.  No current chest pain or other major issues at the present time.  We also discussed his lipid lowering treatment, and his LFT abnormalities which resolved on holding of therapy.    Current Outpatient Prescriptions  Medication Sig Dispense Refill  . aspirin 81 MG tablet Take 81 mg by mouth daily.        Marland Kitchen CHERRY PO Take by mouth daily.        . clopidogrel (PLAVIX) 75 MG tablet Take 1 tablet (75 mg total) by mouth daily.  30 tablet  11  . famotidine (PEPCID AC) 10 MG tablet Take 1 tablet (10 mg total) by mouth as needed for heartburn.      Marland Kitchen lisinopril (PRINIVIL,ZESTRIL) 2.5 MG tablet Take 1 tablet (2.5 mg total) by mouth daily.  30 tablet  11  . metoprolol tartrate (LOPRESSOR) 25 MG tablet Take 1 tablet (25 mg total) by mouth 2 (two) times daily.  60 tablet  11  . nitroGLYCERIN (NITROSTAT) 0.4 MG SL tablet Place 1 tablet (0.4 mg total) under the tongue every 5 (five) minutes as needed.  25 tablet  2   No current facility-administered medications for this visit.    No Known Allergies  Past Medical History  Diagnosis Date  . CAD (coronary artery disease)     a. s/p Ant. STEMI 03/17/11 tx with Promus DES to LAD;  b. cath 03/17/11: LAD 95% (PCI), then 50%, D2 70%;  c. echo 03/17/11: EF 40-45%, septal, apical, distal Ant AK, mod LVH  . Hyperglycemia     a. A1C 5.8 (4/12)  . Dyslipidemia   . CKD (chronic kidney disease)   . HTN (hypertension)   . Gout   . GERD (gastroesophageal reflux disease)     Past Surgical History  Procedure Laterality Date  . Eye surgery      as a child    Family History  Problem Relation Age of Onset  . Coronary artery disease    . Heart attack    . Heart attack Father 38    History     Social History  . Marital Status: Single    Spouse Name: N/A    Number of Children: N/A  . Years of Education: N/A   Occupational History  . Not on file.   Social History Main Topics  . Smoking status: Never Smoker   . Smokeless tobacco: Not on file  . Alcohol Use: No  . Drug Use: No  . Sexually Active: Not on file   Other Topics Concern  . Not on file   Social History Narrative  . No narrative on file    ROS: Please see the HPI.  All other systems reviewed and negative.  PHYSICAL EXAM:  BP 120/88  Pulse 65  Ht 5\' 8"  (1.727 m)  Wt 190 lb (86.183 kg)  BMI 28.9 kg/m2  SpO2 99%  General: Well developed, well nourished, in no acute distress. Head:  Normocephalic and atraumatic. Neck: no JVD Lungs: Clear to auscultation and percussion. Heart: Normal S1 and S2.  No murmur, rubs or gallops.  Pulses: Pulses normal in all 4 extremities. Extremities: No clubbing or cyanosis. No edema. Neurologic: Alert and oriented x 3.  EKG:  ASSESSMENT  AND PLAN:

## 2013-01-29 NOTE — Assessment & Plan Note (Signed)
Reheck.  Last check ok.

## 2013-01-29 NOTE — Assessment & Plan Note (Signed)
Borderline control. 

## 2013-01-29 NOTE — Assessment & Plan Note (Addendum)
Had second generation stent placed in the setting of DAPT.  At present, would continue on medical therapy, but it would be guideline driven to stop clopidogrel at this point in time.  We discussed the guidelines, and also the options, as well as ongoing trial data.  He should remain on ASA indefinitely.

## 2013-01-29 NOTE — Assessment & Plan Note (Signed)
Not currently on statin.  Last LFT were ok.  If they remain so, would give a trial to low dose atorva to see if he does ok with this.

## 2013-02-09 ENCOUNTER — Telehealth: Payer: Self-pay | Admitting: Cardiology

## 2013-02-09 DIAGNOSIS — E7800 Pure hypercholesterolemia, unspecified: Secondary | ICD-10-CM

## 2013-02-09 NOTE — Telephone Encounter (Signed)
New Problem:    Patient called in returning your call. Please call back. 

## 2013-02-14 MED ORDER — ATORVASTATIN CALCIUM 10 MG PO TABS
10.0000 mg | ORAL_TABLET | Freq: Every day | ORAL | Status: DC
Start: 2013-02-14 — End: 2020-08-21

## 2013-02-14 NOTE — Telephone Encounter (Signed)
I spoke with the pt and made him aware of lab results.  Rx sent to pharmacy and the pt will have repeat lab work on 03/28/13.

## 2013-03-28 ENCOUNTER — Other Ambulatory Visit (INDEPENDENT_AMBULATORY_CARE_PROVIDER_SITE_OTHER): Payer: Self-pay

## 2013-03-28 DIAGNOSIS — E7800 Pure hypercholesterolemia, unspecified: Secondary | ICD-10-CM

## 2013-03-28 LAB — LIPID PANEL
HDL: 27.6 mg/dL — ABNORMAL LOW (ref >39.00)
LDL Cholesterol: 69 mg/dL (ref 0–99)
Total CHOL/HDL Ratio: 4
Triglycerides: 67 mg/dL (ref 0.0–149.0)

## 2013-03-28 LAB — HEPATIC FUNCTION PANEL
Albumin: 4 g/dL (ref 3.5–5.2)
Alkaline Phosphatase: 90 U/L (ref 39–117)
Total Bilirubin: 1.7 mg/dL — ABNORMAL HIGH (ref 0.3–1.2)

## 2013-04-06 DIAGNOSIS — I219 Acute myocardial infarction, unspecified: Secondary | ICD-10-CM

## 2013-04-06 HISTORY — DX: Acute myocardial infarction, unspecified: I21.9

## 2013-04-27 ENCOUNTER — Ambulatory Visit: Payer: Self-pay | Admitting: Cardiovascular Disease

## 2013-04-27 ENCOUNTER — Ambulatory Visit (INDEPENDENT_AMBULATORY_CARE_PROVIDER_SITE_OTHER): Payer: Self-pay | Admitting: Cardiovascular Disease

## 2013-04-27 ENCOUNTER — Encounter: Payer: Self-pay | Admitting: Cardiovascular Disease

## 2013-04-27 VITALS — BP 122/76 | HR 58 | Ht 69.0 in | Wt 185.0 lb

## 2013-04-27 DIAGNOSIS — I10 Essential (primary) hypertension: Secondary | ICD-10-CM

## 2013-04-27 DIAGNOSIS — I2510 Atherosclerotic heart disease of native coronary artery without angina pectoris: Secondary | ICD-10-CM

## 2013-04-27 NOTE — Patient Instructions (Addendum)
Your physician wants you to follow-up in:  6 months. You will receive a reminder letter in the mail two months in advance. If you don't receive a letter, please call our office to schedule the follow-up appointment.   

## 2013-04-27 NOTE — Progress Notes (Signed)
History of Present Illness: 47 yo male with history of CAD s/p STEMI April 2012, HTN, HLD, CKD who is here today for cardiac follow up. He has been followed by Dr. Riley Kill. He presented April 2012 with an anterior STEMI, LAD treated with a 2.75 x 16 mm Promus Element DES. Diagonal noted to have moderate disease. His Plavix was stopped in February 2014. He initially had abnormal LFTs and his statin was held. Atorvastatin restarted at 10 mg per day February 2014 and LFTs normal April 2014. Last echo April 2013 The cavity size was normal. Wall thickness was normal. The estimated ejection fraction was 55%. Wall motion was normal; there were no regional wall motion abnormalities.  He is here today for follow up. He is feeling well. No chest pain or SOB. He has stopped exercising.   Primary Care Physician: None  Last Lipid Profile:Lipid Panel     Component Value Date/Time   CHOL 110 03/28/2013 1218   TRIG 67.0 03/28/2013 1218   HDL 27.60* 03/28/2013 1218   CHOLHDL 4 03/28/2013 1218   VLDL 13.4 03/28/2013 1218   LDLCALC 69 03/28/2013 1218     Past Medical History  Diagnosis Date  . CAD (coronary artery disease)     a. s/p Ant. STEMI 03/17/11 tx with Promus DES to LAD;  b. cath 03/17/11: LAD 95% (PCI), then 50%, D2 70%;  c. echo 03/17/11: EF 40-45%, septal, apical, distal Ant AK, mod LVH  . Hyperglycemia     a. A1C 5.8 (4/12)  . Dyslipidemia   . CKD (chronic kidney disease)   . HTN (hypertension)   . Gout   . GERD (gastroesophageal reflux disease)     Past Surgical History  Procedure Laterality Date  . Eye surgery      as a child    Current Outpatient Prescriptions  Medication Sig Dispense Refill  . aspirin 81 MG tablet Take 81 mg by mouth daily.        Marland Kitchen atorvastatin (LIPITOR) 10 MG tablet Take 1 tablet (10 mg total) by mouth daily.  90 tablet  3  . CHERRY PO Take by mouth daily.        . famotidine (PEPCID AC) 10 MG tablet Take 1 tablet (10 mg total) by mouth as needed for heartburn.       Marland Kitchen lisinopril (PRINIVIL,ZESTRIL) 2.5 MG tablet Take 1 tablet (2.5 mg total) by mouth daily.  30 tablet  11  . metoprolol tartrate (LOPRESSOR) 25 MG tablet Take 1 tablet (25 mg total) by mouth 2 (two) times daily.  60 tablet  11  . nitroGLYCERIN (NITROSTAT) 0.4 MG SL tablet Place 1 tablet (0.4 mg total) under the tongue every 5 (five) minutes as needed.  25 tablet  2   No current facility-administered medications for this visit.    No Known Allergies  History   Social History  . Marital Status: Single    Spouse Name: N/A    Number of Children: 0  . Years of Education: N/A   Occupational History  . Not on file.   Social History Main Topics  . Smoking status: Never Smoker   . Smokeless tobacco: Not on file  . Alcohol Use: No  . Drug Use: No  . Sexually Active: Not on file   Other Topics Concern  . Not on file   Social History Narrative  . No narrative on file    Family History  Problem Relation Age of Onset  . Coronary artery  disease Father   . Heart attack Father 31    Review of Systems:  As stated in the HPI and otherwise negative.   BP 122/76  Pulse 58  Ht 5\' 9"  (1.753 m)  Wt 185 lb (83.915 kg)  BMI 27.31 kg/m2  SpO2 98%  Physical Examination: General: Well developed, well nourished, NAD HEENT: OP clear, mucus membranes moist SKIN: warm, dry. No rashes. Neuro: No focal deficits Musculoskeletal: Muscle strength 5/5 all ext Psychiatric: Mood and affect normal Neck: No JVD, no carotid bruits, no thyromegaly, no lymphadenopathy. Lungs:Clear bilaterally, no wheezes, rhonci, crackles Cardiovascular: Regular rate and rhythm. No murmurs, gallops or rubs. Abdomen:Soft. Bowel sounds present. Non-tender.  Extremities: No lower extremity edema. Pulses are 2 + in the bilateral DP/PT.  Echo: April 2013: Left ventricle: The cavity size was normal. Wall thickness was normal. The estimated ejection fraction was 55%. Wall motion was normal; there were no regional  wall motion abnormalities.  Assessment and Plan:   1. CAD: Stable. PCI in setting of anterior STEMI in April 201 with placement of DES in the mid LAD. Will continue ASA, statin, beta blocker.     2. Dyslipidemia:  Tolerating low dose Lipitor. LFTS ok last month. Lipids well controlled. No changes today.     3. HTN:  BP well controlled. No changes today.

## 2013-06-12 ENCOUNTER — Ambulatory Visit: Payer: Self-pay | Admitting: Cardiovascular Disease

## 2013-10-12 ENCOUNTER — Other Ambulatory Visit: Payer: Self-pay

## 2014-12-07 HISTORY — PX: OTHER SURGICAL HISTORY: SHX169

## 2020-05-16 ENCOUNTER — Ambulatory Visit (INDEPENDENT_AMBULATORY_CARE_PROVIDER_SITE_OTHER): Payer: Self-pay | Admitting: Family Medicine

## 2020-05-16 ENCOUNTER — Encounter: Payer: Self-pay | Admitting: Family Medicine

## 2020-05-16 ENCOUNTER — Ambulatory Visit: Payer: Self-pay | Admitting: Family Medicine

## 2020-05-16 ENCOUNTER — Other Ambulatory Visit: Payer: Self-pay

## 2020-05-16 VITALS — BP 97/69 | HR 90 | Temp 98.1°F | Ht 69.0 in | Wt 167.2 lb

## 2020-05-16 DIAGNOSIS — I951 Orthostatic hypotension: Secondary | ICD-10-CM

## 2020-05-16 DIAGNOSIS — Z8616 Personal history of COVID-19: Secondary | ICD-10-CM

## 2020-05-16 DIAGNOSIS — Z8679 Personal history of other diseases of the circulatory system: Secondary | ICD-10-CM

## 2020-05-16 DIAGNOSIS — R42 Dizziness and giddiness: Secondary | ICD-10-CM

## 2020-05-16 DIAGNOSIS — M109 Gout, unspecified: Secondary | ICD-10-CM

## 2020-05-16 DIAGNOSIS — R52 Pain, unspecified: Secondary | ICD-10-CM

## 2020-05-16 MED ORDER — COLCHICINE 0.6 MG PO TABS: ORAL_TABLET | ORAL | 0 refills | Status: AC

## 2020-05-16 MED ORDER — PREDNISONE 20 MG PO TABS: ORAL_TABLET | ORAL | 0 refills | Status: DC

## 2020-05-16 NOTE — Patient Instructions (Addendum)
Prednisone 20 mg.  Take 3 pills this evening when you get it, then 3 pills daily Friday and Saturday, then 2 pills daily for 3 days, then 1 pill daily for 3 days.  Take colchicine 2 pills initially, then 1 twice daily as needed for gout.  Colchicine frequently can cause diarrhea, and if that develops decrease your doses.  Take ibuprofen 200 mg 3 or 4 pills 3 times daily.  Do not exceed 2400 mg in 24 hours.  Take with food.  If it causes stomach pain discontinue it.  Drink plenty of fluids to stay well-hydrated  Discontinue your lisinopril  In the event of getting more dizziness or any passing out spells go to the emergency room.  Make an appointment to see a primary care doctor sometime in the next few weeks.  You may end up needing to be on other medication for gout, but we want to see what your cancer looks like first.  Look on the Internet at list of foods known to cause gout, and see if you can eliminate some of the main causes that you eat.  We do not know if you are still infectious for gout.  You should continue staying at home and away from people for a while longer.  I assume that you are feeling bad today from the gout.  Always make certain you have informed offices or the emergency room that you have just had Covid.   Gout  Gout is painful swelling of your joints. Gout is a type of arthritis. It is caused by having too much uric acid in your body. Uric acid is a chemical that is made when your body breaks down substances called purines. If your body has too much uric acid, sharp crystals can form and build up in your joints. This causes pain and swelling. Gout attacks can happen quickly and be very painful (acute gout). Over time, the attacks can affect more joints and happen more often (chronic gout). What are the causes?  Too much uric acid in your blood. This can happen because: ? Your kidneys do not remove enough uric acid from your blood. ? Your body makes too much uric  acid. ? You eat too many foods that are high in purines. These foods include organ meats, some seafood, and beer.  Trauma or stress. What increases the risk?  Having a family history of gout.  Being male and middle-aged.  Being male and having gone through menopause.  Being very overweight (obese).  Drinking alcohol, especially beer.  Not having enough water in the body (being dehydrated).  Losing weight too quickly.  Having an organ transplant.  Having lead poisoning.  Taking certain medicines.  Having kidney disease.  Having a skin condition called psoriasis. What are the signs or symptoms? An attack of acute gout usually happens in just one joint. The most common place is the big toe. Attacks often start at night. Other joints that may be affected include joints of the feet, ankle, knee, fingers, wrist, or elbow. Symptoms of an attack may include:  Very bad pain.  Warmth.  Swelling.  Stiffness.  Shiny, red, or purple skin.  Tenderness. The affected joint may be very painful to touch.  Chills and fever. Chronic gout may cause symptoms more often. More joints may be involved. You may also have white or yellow lumps (tophi) on your hands or feet or in other areas near your joints. How is this treated?  Treatment for this  condition has two phases: treating an acute attack and preventing future attacks.  Acute gout treatment may include: ? NSAIDs. ? Steroids. These are taken by mouth or injected into a joint. ? Colchicine. This medicine relieves pain and swelling. It can be given by mouth or through an IV tube.  Preventive treatment may include: ? Taking small doses of NSAIDs or colchicine daily. ? Using a medicine that reduces uric acid levels in your blood. ? Making changes to your diet. You may need to see a food expert (dietitian) about what to eat and drink to prevent gout. Follow these instructions at home: During a gout attack   If told, put ice  on the painful area: ? Put ice in a plastic bag. ? Place a towel between your skin and the bag. ? Leave the ice on for 20 minutes, 2-3 times a day.  Raise (elevate) the painful joint above the level of your heart as often as you can.  Rest the joint as much as possible. If the joint is in your leg, you may be given crutches.  Follow instructions from your doctor about what you cannot eat or drink. Avoiding future gout attacks  Eat a low-purine diet. Avoid foods and drinks such as: ? Liver. ? Kidney. ? Anchovies. ? Asparagus. ? Herring. ? Mushrooms. ? Mussels. ? Beer.  Stay at a healthy weight. If you want to lose weight, talk with your doctor. Do not lose weight too fast.  Start or continue an exercise plan as told by your doctor. Eating and drinking  Drink enough fluids to keep your pee (urine) pale yellow.  If you drink alcohol: ? Limit how much you use to:  0-1 drink a day for women.  0-2 drinks a day for men. ? Be aware of how much alcohol is in your drink. In the [place] one drink equals one 12 oz bottle of beer (355 mL), one 5 oz glass of wine (148 mL), or one 1 oz glass of hard liquor (44 mL). General instructions  Take over-the-counter and prescription medicines only as told by your doctor.  Do not drive or use heavy machinery while taking prescription pain medicine.  Return to your normal activities as told by your doctor. Ask your doctor what activities are safe for you.  Keep all follow-up visits as told by your doctor. This is important. Contact a doctor if:  You have another gout attack.  You still have symptoms of a gout attack after 10 days of treatment.  You have problems (side effects) because of your medicines.  You have chills or a fever.  You have burning pain when you pee (urinate).  You have pain in your lower back or belly. Get help right away if:  You have very bad pain.  Your pain cannot be controlled.  You cannot  pee. Summary  Gout is painful swelling of the joints.  The most common site of pain is the big toe, but it can affect other joints.  Medicines and avoiding some foods can help to prevent and treat gout attacks. This information is not intended to replace advice given to you by your health care provider. Make sure you discuss any questions you have with your health care provider. Document Revised: 06/15/2018 Document Reviewed: 06/15/2018 Elsevier Patient Education  [employer].    If you have lab work done today you will be contacted with your lab results within the next 2 weeks.  If you have not  heard from [place] then please contact us. The fastest way to get your results is to register for My Chart.   IF you received an x-ray today, you will receive an invoice from [facility]. Please contact [facility] at [phone] with questions or concerns regarding your invoice.   IF you received labwork today, you will receive an invoice from [facility]. Please contact [facility] at [phone] with questions or concerns regarding your invoice.   Our billing staff will not be able to assist you with questions regarding bills from these companies.  You will be contacted with the lab results as soon as they are available. The fastest way to get your results is to activate your My Chart account. Instructions are located on the last page of this paperwork. If you have not heard from [place] regarding the results in 2 weeks, please contact this office.

## 2020-05-16 NOTE — Progress Notes (Signed)
Patient ID: Jason Daugherty, male    DOB: 1966-02-02  Age: 54 y.o. MRN: [id number]  Chief Complaint  Patient presents with  . Knee Pain    hx of gout. leg and knee pain soreness, numbness, throbbing and tingling  . Dizziness    syncope x 2 week. Just got over covid 14 days ago Dx May 04, 2020    Subjective:   Patient has been having a lot of trouble with pain in his joints over the last few weeks or month or so.  It is gotten intolerable today.  He has been having some light headedness.  Apparently had Covid a couple of weeks ago, [facility]  He has quarantine himself for 2 weeks and came out today for this.  He is walking with a cane now because he hurts so much.  He still feels weak and tired and has had dizziness since Covid.  Has a history of hypertension and some low-dose of lisinopril.  This is his first visit here.  His mother accompanies him here today.  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  His mother says she is taking care of his needs for right now since he has no money and no insurance.  Current allergies, medications, problem list, past/family and social histories reviewed.  Objective:  BP 97/69 (BP Location: Right Arm, Patient Position: Standing, Cuff Size: Normal)   Pulse 90   Temp 98.1 F (36.7 C) (Temporal)   Ht 5\' 9"  (1.753 m)   Wt 167 lb 3.2 oz (75.8 kg)   SpO2 99%   BMI 24.69 kg/m  Walking with a cane and hurts so much she is struggling to get along.  Has neck is supple but tender to touch.  Chest clear.  Heart regular without murmur.  He has good oxygenation on his pulse ox.  Seem to be a little bit rapid in his heart rate.  Got a significantly swollen left knee mild effusion in the right knee.  He is tender over number of his joints.  Assessment & Plan:   Assessment: 1. Gouty arthritis   2. Generalized pain   3. Personal history of covid-19   4. Dizziness   5. History of essential hypertension   6.  Orthostatic hypotension       Plan: We will treat his arthritis which I think will help a lot.  Check CBC to be sure is not anemic.  Send him out the back door because of his recent Covid.  His blood pressure is on the low side and I am urging him to push fluids.  Return to the ER if worse.  Make an appointment for follow-up.  Orders Placed This Encounter  Procedures  . CBC  . Uric acid    Meds ordered this encounter  Medications  . predniSONE (DELTASONE) 20 MG tablet    Sig: Take 3 pills daily for 3 days, then 2 daily for 3 days, then 1 daily for 3 days for gout    Dispense:  18 tablet    Refill:  0  . colchicine 0.6 MG tablet    Sig: Take 2 pills initially, then 1 every 12 hours as needed for gout.    Dispense:  20 tablet    Refill:  0         Patient Instructions    Prednisone 20 mg.  Take 3 pills this evening when you get it, then  3 pills daily Friday and Saturday, then 2 pills daily for 3 days, then 1 pill daily for 3 days.  Take colchicine 2 pills initially, then 1 twice daily as needed for gout.  Colchicine frequently can cause diarrhea, and if that develops decrease your doses.  Take ibuprofen 200 mg 3 or 4 pills 3 times daily.  Do not exceed 2400 mg in 24 hours.  Take with food.  If it causes stomach pain discontinue it.  Drink plenty of fluids to stay well-hydrated  Discontinue your lisinopril  In the event of getting more dizziness or any passing out spells go to the emergency room.  Make an appointment to see a primary care doctor sometime in the next few weeks.  You may end up needing to be on other medication for gout, but we want to see what your cancer looks like first.  Look on the Internet at list of foods known to cause gout, and see if you can eliminate some of the main causes that you eat.  We do not know if you are still infectious for gout.  You should continue staying at home and away from people for a while longer.  I assume that you are  feeling bad today from the gout.  Always make certain you have informed offices or the emergency room that you have just had Covid.   Gout  Gout is painful swelling of your joints. Gout is a type of arthritis. It is caused by having too much uric acid in your body. Uric acid is a chemical that is made when your body breaks down substances called purines. If your body has too much uric acid, sharp crystals can form and build up in your joints. This causes pain and swelling. Gout attacks can happen quickly and be very painful (acute gout). Over time, the attacks can affect more joints and happen more often (chronic gout). What are the causes?  Too much uric acid in your blood. This can happen because: ? Your kidneys do not remove enough uric acid from your blood. ? Your body makes too much uric acid. ? You eat too many foods that are high in purines. These foods include organ meats, some seafood, and beer.  Trauma or stress. What increases the risk?  Having a family history of gout.  Being male and middle-aged.  Being male and having gone through menopause.  Being very overweight (obese).  Drinking alcohol, especially beer.  Not having enough water in the body (being dehydrated).  Losing weight too quickly.  Having an organ transplant.  Having lead poisoning.  Taking certain medicines.  Having kidney disease.  Having a skin condition called psoriasis. What are the signs or symptoms? An attack of acute gout usually happens in just one joint. The most common place is the big toe. Attacks often start at night. Other joints that may be affected include joints of the feet, ankle, knee, fingers, wrist, or elbow. Symptoms of an attack may include:  Very bad pain.  Warmth.  Swelling.  Stiffness.  Shiny, red, or purple skin.  Tenderness. The affected joint may be very painful to touch.  Chills and fever. Chronic gout may cause symptoms more often. More joints may be  involved. You may also have white or yellow lumps (tophi) on your hands or feet or in other areas near your joints. How is this treated?  Treatment for this condition has two phases: treating an acute attack and preventing future attacks.  Acute gout treatment may include: ? NSAIDs. ? Steroids. These are taken by mouth or injected into a joint. ? Colchicine. This medicine relieves pain and swelling. It can be given by mouth or through an IV tube.  Preventive treatment may include: ? Taking small doses of NSAIDs or colchicine daily. ? Using a medicine that reduces uric acid levels in your blood. ? Making changes to your diet. You may need to see a food expert (dietitian) about what to eat and drink to prevent gout. Follow these instructions at home: During a gout attack   If told, put ice on the painful area: ? Put ice in a plastic bag. ? Place a towel between your skin and the bag. ? Leave the ice on for 20 minutes, 2-3 times a day.  Raise (elevate) the painful joint above the level of your heart as often as you can.  Rest the joint as much as possible. If the joint is in your leg, you may be given crutches.  Follow instructions from your doctor about what you cannot eat or drink. Avoiding future gout attacks  Eat a low-purine diet. Avoid foods and drinks such as: ? Liver. ? Kidney. ? Anchovies. ? Asparagus. ? Herring. ? Mushrooms. ? Mussels. ? Beer.  Stay at a healthy weight. If you want to lose weight, talk with your doctor. Do not lose weight too fast.  Start or continue an exercise plan as told by your doctor. Eating and drinking  Drink enough fluids to keep your pee (urine) pale yellow.  If you drink alcohol: ? Limit how much you use to:  0-1 drink a day for women.  0-2 drinks a day for men. ? Be aware of how much alcohol is in your drink. In the [place] one drink equals one 12 oz bottle of beer (355 mL), one 5 oz glass of wine (148 mL), or one 1 oz glass of  hard liquor (44 mL). General instructions  Take over-the-counter and prescription medicines only as told by your doctor.  Do not drive or use heavy machinery while taking prescription pain medicine.  Return to your normal activities as told by your doctor. Ask your doctor what activities are safe for you.  Keep all follow-up visits as told by your doctor. This is important. Contact a doctor if:  You have another gout attack.  You still have symptoms of a gout attack after 10 days of treatment.  You have problems (side effects) because of your medicines.  You have chills or a fever.  You have burning pain when you pee (urinate).  You have pain in your lower back or belly. Get help right away if:  You have very bad pain.  Your pain cannot be controlled.  You cannot pee. Summary  Gout is painful swelling of the joints.  The most common site of pain is the big toe, but it can affect other joints.  Medicines and avoiding some foods can help to prevent and treat gout attacks. This information is not intended to replace advice given to you by your health care provider. Make sure you discuss any questions you have with your health care provider. Document Revised: 06/15/2018 Document Reviewed: 06/15/2018 Elsevier Patient Education  [employer].    If you have lab work done today you will be contacted with your lab results within the next 2 weeks.  If you have not heard from [place] then please contact us. The fastest way to get your results  is to register for My Chart.   IF you received an x-ray today, you will receive an invoice from [facility]. Please contact [facility] at [phone] with questions or concerns regarding your invoice.   IF you received labwork today, you will receive an invoice from [facility]. Please contact [facility] at [phone] with questions or concerns regarding your invoice.   Our billing staff will not be able to assist you  with questions regarding bills from these companies.  You will be contacted with the lab results as soon as they are available. The fastest way to get your results is to activate your My Chart account. Instructions are located on the last page of this paperwork. If you have not heard from [place] regarding the results in 2 weeks, please contact this office.         Return in about 2 weeks (around 05/30/2020), or Needs primary.   Janace Hoard, MD 05/16/2020

## 2020-05-17 ENCOUNTER — Other Ambulatory Visit: Payer: Self-pay | Admitting: Family Medicine

## 2020-05-17 DIAGNOSIS — E790 Hyperuricemia without signs of inflammatory arthritis and tophaceous disease: Secondary | ICD-10-CM

## 2020-05-17 DIAGNOSIS — M109 Gout, unspecified: Secondary | ICD-10-CM

## 2020-05-17 LAB — CBC
Hematocrit: 38.6 % (ref 37.5–51.0)
Hemoglobin: 13 g/dL (ref 13.0–17.7)
MCH: 28.2 pg (ref 26.6–33.0)
MCHC: 33.7 g/dL (ref 31.5–35.7)
MCV: 84 fL (ref 79–97)
Platelets: 400 10*3/uL (ref 150–450)
RBC: 4.61 x10E6/uL (ref 4.14–5.80)
RDW: 12.4 % (ref 11.6–15.4)
WBC: 12.4 10*3/uL — ABNORMAL HIGH (ref 3.4–10.8)

## 2020-05-17 LAB — URIC ACID: Uric Acid: 8.4 mg/dL (ref 3.8–8.4)

## 2020-05-17 MED ORDER — ALLOPURINOL 100 MG PO TABS: 100.0000 mg | ORAL_TABLET | Freq: Every day | ORAL | 2 refills | Status: DC

## 2020-05-20 ENCOUNTER — Telehealth: Payer: Self-pay

## 2020-05-20 NOTE — Telephone Encounter (Signed)
Patient has been informed per his lab results and recommendations, pt verbalizes understanding .

## 2020-06-27 ENCOUNTER — Telehealth: Payer: Self-pay | Admitting: Registered Nurse

## 2020-06-27 NOTE — Telephone Encounter (Signed)
Called pt LVMTCB on 06/27/20 @ 2;08 Pm

## 2020-06-28 NOTE — Telephone Encounter (Signed)
Pt called back and resch appt for another day for an in person visit.

## 2020-07-22 ENCOUNTER — Ambulatory Visit: Payer: Self-pay | Admitting: Registered Nurse

## 2020-08-19 ENCOUNTER — Other Ambulatory Visit: Payer: Self-pay | Admitting: Family Medicine

## 2020-08-19 DIAGNOSIS — E790 Hyperuricemia without signs of inflammatory arthritis and tophaceous disease: Secondary | ICD-10-CM

## 2020-08-20 ENCOUNTER — Encounter: Payer: Self-pay | Admitting: Cardiovascular Disease

## 2020-08-21 ENCOUNTER — Encounter: Payer: Self-pay | Admitting: Registered Nurse

## 2020-08-21 ENCOUNTER — Ambulatory Visit (INDEPENDENT_AMBULATORY_CARE_PROVIDER_SITE_OTHER): Payer: Self-pay | Admitting: Registered Nurse

## 2020-08-21 ENCOUNTER — Other Ambulatory Visit: Payer: Self-pay

## 2020-08-21 VITALS — BP 135/83 | HR 80 | Temp 98.6°F | Resp 18 | Ht 69.0 in | Wt 180.2 lb

## 2020-08-21 DIAGNOSIS — E790 Hyperuricemia without signs of inflammatory arthritis and tophaceous disease: Secondary | ICD-10-CM

## 2020-08-21 DIAGNOSIS — M109 Gout, unspecified: Secondary | ICD-10-CM

## 2020-08-21 DIAGNOSIS — R944 Abnormal results of kidney function studies: Secondary | ICD-10-CM

## 2020-08-21 DIAGNOSIS — Z23 Encounter for immunization: Secondary | ICD-10-CM

## 2020-08-21 DIAGNOSIS — Z1211 Encounter for screening for malignant neoplasm of colon: Secondary | ICD-10-CM

## 2020-08-21 DIAGNOSIS — E785 Hyperlipidemia, unspecified: Secondary | ICD-10-CM

## 2020-08-21 DIAGNOSIS — I2510 Atherosclerotic heart disease of native coronary artery without angina pectoris: Secondary | ICD-10-CM

## 2020-08-21 DIAGNOSIS — E7800 Pure hypercholesterolemia, unspecified: Secondary | ICD-10-CM

## 2020-08-21 DIAGNOSIS — R945 Abnormal results of liver function studies: Secondary | ICD-10-CM

## 2020-08-21 DIAGNOSIS — I10 Essential (primary) hypertension: Secondary | ICD-10-CM

## 2020-08-21 DIAGNOSIS — R7989 Other specified abnormal findings of blood chemistry: Secondary | ICD-10-CM

## 2020-08-21 MED ORDER — LISINOPRIL 2.5 MG PO TABS
2.5000 mg | ORAL_TABLET | Freq: Every day | ORAL | 11 refills | Status: DC
Start: 2020-08-21 — End: 2021-06-30

## 2020-08-21 MED ORDER — ATORVASTATIN CALCIUM 10 MG PO TABS: 10.0000 mg | ORAL_TABLET | Freq: Every day | ORAL | 3 refills | Status: DC

## 2020-08-21 MED ORDER — NITROGLYCERIN 0.4 MG SL SUBL: 0.4000 mg | SUBLINGUAL_TABLET | SUBLINGUAL | 2 refills | Status: AC | PRN

## 2020-08-21 NOTE — Progress Notes (Signed)
Established Patient Office Visit  Subjective:  Patient ID: Jason Daugherty, male    DOB: 1966/05/02  Age: 54 y.o. MRN: [id number]  CC:  Chief Complaint  Patient presents with   New Patient (Initial Visit)    Patient states he is here to Establish care. Patient was seeen in office for covid and gout and was prescribed medication and would like to follow up    HPI [name] presents for visit to establish care. Here with his mother.  Seen for COVID f/u and gout in June with Dr. Alwyn Ren. At that time in much pain, generalized arthralgias worst in knees, and having some COVID after effects. Today feeling well and without concern. Would like to follow up on lab work done at previous visit.   Hx of MI and CAD as detailed in history. Feeling well at this time. No recent CV symptoms.   Does note some cough - mild production. No shob, doe, headaches, chest pain, nvd. Discussed likely allergies vs mild asthma - will try albuterol inhaler.  Due for routine screening colonoscopy, referral sent today.  Past Medical History:  Diagnosis Date   CAD (coronary artery disease)    a. s/p Ant. STEMI 03/17/11 tx with Promus DES to LAD;  b. cath 03/17/11: LAD 95% (PCI), then 50%, D2 70%;  c. echo 03/17/11: EF 40-45%, septal, apical, distal Ant AK, mod LVH   CKD (chronic kidney disease)    Dyslipidemia    GERD (gastroesophageal reflux disease)    Gout    Heart attack (HCC) 04/06/2013   HTN (hypertension)    Hyperglycemia    a. A1C 5.8 (4/12)   Hypertension     Past Surgical History:  Procedure Laterality Date   EYE SURGERY     as a child   EYE SURGERY N/A    Phreesia 08/18/2020   stint  2016    Family History  Problem Relation Age of Onset   Hyperlipidemia Mother    Heart disease Father    Coronary artery disease Father    Heart attack Father 51   Heart murmur Father     Social History   Socioeconomic History   Marital status: Single    Spouse name: Not on file    Number of children: 0   Years of education: Not on file   Highest education level: Not on file  Occupational History   Not on file  Tobacco Use   Smoking status: Never Smoker   Smokeless tobacco: Never Used  Vaping Use   Vaping Use: Never used  Substance and Sexual Activity   Alcohol use: Not Currently   Drug use: Not Currently   Sexual activity: Not Currently  Other Topics Concern   Not on file  Social History Narrative   ** Merged History Encounter **       Social Determinants of Health   Financial Resource Strain:    Difficulty of Paying Living Expenses: Not on file  Food Insecurity:    Worried About Programme researcher, broadcasting/film/video in the Last Year: Not on file   [employer] of Food in the Last Year: Not on file  Transportation Needs:    Lack of Transportation (Medical): Not on file   Lack of Transportation (Non-Medical): Not on file  Physical Activity:    Days of Exercise per Week: Not on file   Minutes of Exercise per Session: Not on file  Stress:    Feeling of Stress : Not on file  Social Connections:    Frequency of Communication with Friends and Family: Not on file   Frequency of Social Gatherings with Friends and Family: Not on file   Attends Religious Services: Not on Scientist, clinical (histocompatibility and immunogenetics) or Organizations: Not on file   Attends Banker Meetings: Not on file   Marital Status: Not on file  Intimate Partner Violence:    Fear of Current or Ex-Partner: Not on file   Emotionally Abused: Not on file   Physically Abused: Not on file   Sexually Abused: Not on file    Outpatient Medications Prior to Visit  Medication Sig Dispense Refill   allopurinol (ZYLOPRIM) 100 MG tablet TAKE 1 TABLET(100 MG) BY MOUTH DAILY 30 tablet 2   aspirin 81 MG tablet Take 81 mg by mouth daily.       CHERRY PO Take by mouth daily.       colchicine 0.6 MG tablet Take 2 pills initially, then 1 every 12 hours as needed for gout. 20 tablet 0    famotidine (PEPCID AC) 10 MG tablet Take 1 tablet (10 mg total) by mouth as needed for heartburn.     lisinopril (ZESTRIL) 2.5 MG tablet Take 2.5 mg by mouth daily.     metoprolol tartrate (LOPRESSOR) 25 MG tablet Take 1 tablet (25 mg total) by mouth 2 (two) times daily. 60 tablet 11   predniSONE (DELTASONE) 20 MG tablet Take 3 pills daily for 3 days, then 2 daily for 3 days, then 1 daily for 3 days for gout 18 tablet 0   atorvastatin (LIPITOR) 10 MG tablet Take 1 tablet (10 mg total) by mouth daily. 90 tablet 3   nitroGLYCERIN (NITROSTAT) 0.4 MG SL tablet Place 1 tablet (0.4 mg total) under the tongue every 5 (five) minutes as needed. 25 tablet 2   lisinopril (PRINIVIL,ZESTRIL) 2.5 MG tablet Take 1 tablet (2.5 mg total) by mouth daily. 30 tablet 11   No facility-administered medications prior to visit.    No Known Allergies  ROS Review of Systems Per hpi     Objective:    Physical Exam Vitals and nursing note reviewed.  Constitutional:      General: He is not in acute distress.    Appearance: Normal appearance. He is normal weight. He is not ill-appearing, toxic-appearing or diaphoretic.  Cardiovascular:     Rate and Rhythm: Normal rate and regular rhythm.     Pulses: Normal pulses.     Heart sounds: Normal heart sounds. No murmur heard.  No friction rub. No gallop.   Pulmonary:     Effort: Pulmonary effort is normal. No respiratory distress.     Breath sounds: Normal breath sounds. No stridor. No wheezing, rhonchi or rales.  Chest:     Chest wall: No tenderness.  Skin:    General: Skin is warm and dry.     Capillary Refill: Capillary refill takes less than 2 seconds.  Neurological:     General: No focal deficit present.     Mental Status: He is alert and oriented to person, place, and time. Mental status is at baseline.  Psychiatric:        Mood and Affect: Mood normal.        Behavior: Behavior normal.        Thought Content: Thought content normal.         Judgment: Judgment normal.     BP 135/83    Pulse 80  Temp 98.6 F (37 C) (Temporal)    Resp 18    Ht 5\' 9"  (1.753 m)    Wt 180 lb 3.2 oz (81.7 kg)    SpO2 98%    BMI 26.61 kg/m  Wt Readings from Last 3 Encounters:  08/21/20 180 lb 3.2 oz (81.7 kg)  05/16/20 167 lb 3.2 oz (75.8 kg)  04/27/13 185 lb (83.9 kg)     Health Maintenance Due  Topic Date Due   TETANUS/TDAP  Never done   COLONOSCOPY  Never done    There are no preventive care reminders to display for this patient.  No results found for: TSH Lab Results  Component Value Date   WBC 12.4 (H) 05/16/2020   HGB 13.0 05/16/2020   HCT 38.6 05/16/2020   MCV 84 05/16/2020   PLT 400 05/16/2020   Lab Results  Component Value Date   NA 136 10/19/2012   K 4.0 10/19/2012   CO2 30 10/19/2012   GLUCOSE 91 10/19/2012   BUN 18 10/19/2012   CREATININE 1.5 10/19/2012   BILITOT 1.7 (H) 03/28/2013   ALKPHOS 90 03/28/2013   AST 27 03/28/2013   ALT 34 03/28/2013   PROT 7.1 03/28/2013   ALBUMIN 4.0 03/28/2013   CALCIUM 9.2 10/19/2012   GFR 56.10 (L) 10/19/2012   Lab Results  Component Value Date   CHOL 110 03/28/2013   Lab Results  Component Value Date   HDL 27.60 (L) 03/28/2013   Lab Results  Component Value Date   LDLCALC 69 03/28/2013   Lab Results  Component Value Date   TRIG 67.0 03/28/2013   Lab Results  Component Value Date   CHOLHDL 4 03/28/2013   Lab Results  Component Value Date   HGBA1C 5.5 01/13/2012      Assessment & Plan:   Problem List Items Addressed This Visit      Cardiovascular and Mediastinum   CAD (coronary artery disease)   Relevant Medications   nitroGLYCERIN (NITROSTAT) 0.4 MG SL tablet   lisinopril (ZESTRIL) 2.5 MG tablet   atorvastatin (LIPITOR) 10 MG tablet   HTN (hypertension)   Relevant Medications   nitroGLYCERIN (NITROSTAT) 0.4 MG SL tablet   lisinopril (ZESTRIL) 2.5 MG tablet   atorvastatin (LIPITOR) 10 MG tablet     Other   Dyslipidemia   Relevant  Medications   nitroGLYCERIN (NITROSTAT) 0.4 MG SL tablet   lisinopril (ZESTRIL) 2.5 MG tablet   atorvastatin (LIPITOR) 10 MG tablet   Abnormal LFTs   Relevant Medications   nitroGLYCERIN (NITROSTAT) 0.4 MG SL tablet   lisinopril (ZESTRIL) 2.5 MG tablet    Other Visit Diagnoses    Special screening for malignant neoplasms, colon    -  Primary   Relevant Orders   Ambulatory referral to Gastroenterology   Hyperuricemia       Relevant Orders   CBC   Uric Acid   Pure hypercholesterolemia       Relevant Medications   nitroGLYCERIN (NITROSTAT) 0.4 MG SL tablet   lisinopril (ZESTRIL) 2.5 MG tablet   atorvastatin (LIPITOR) 10 MG tablet   Decreased GFR       Relevant Orders   Basic Metabolic Panel   Gouty arthritis       Relevant Orders   CBC   Uric Acid   Flu vaccine need          Meds ordered this encounter  Medications   nitroGLYCERIN (NITROSTAT) 0.4 MG SL tablet    Sig: Place 1  tablet (0.4 mg total) under the tongue every 5 (five) minutes as needed.    Dispense:  25 tablet    Refill:  2    Order Specific Question:   Supervising Provider    Answer:   Neva SeatGREENE, JEFFREY R [[id number]]   lisinopril (ZESTRIL) 2.5 MG tablet    Sig: Take 1 tablet (2.5 mg total) by mouth daily.    Dispense:  30 tablet    Refill:  11    Order Specific Question:   Supervising Provider    Answer:   Neva SeatGREENE, JEFFREY R [[id number]]   atorvastatin (LIPITOR) 10 MG tablet    Sig: Take 1 tablet (10 mg total) by mouth daily.    Dispense:  90 tablet    Refill:  3    Order Specific Question:   Supervising Provider    Answer:   Neva SeatGREENE, JEFFREY R [[id number]]    Follow-up: No follow-ups on file.   PLAN  Histories reviewed  Refills as above  Labs collected, will follow up as warranted and plan follow up visit based on results  Patient encouraged to call clinic with any questions, comments, or concerns.  Janeece Ageeichard Jeray Shugart, NP

## 2020-08-21 NOTE — Patient Instructions (Signed)
° ° ° °  If you have lab work done today you will be contacted with your lab results within the next 2 weeks.  If you have not heard from us then please contact us. The fastest way to get your results is to register for My Chart. ° ° °IF you received an x-ray today, you will receive an invoice from [facility]. Please contact [facility] at [phone] with questions or concerns regarding your invoice.  ° °IF you received labwork today, you will receive an invoice from [facility]. Please contact [facility] at [phone] with questions or concerns regarding your invoice.  ° °Our billing staff will not be able to assist you with questions regarding bills from these companies. ° °You will be contacted with the lab results as soon as they are available. The fastest way to get your results is to activate your My Chart account. Instructions are located on the last page of this paperwork. If you have not heard from us regarding the results in 2 weeks, please contact this office. °  ° ° ° °

## 2020-08-22 LAB — CBC
Hematocrit: 46 % (ref 37.5–51.0)
Hemoglobin: 15.8 g/dL (ref 13.0–17.7)
MCH: 30.3 pg (ref 26.6–33.0)
MCHC: 34.3 g/dL (ref 31.5–35.7)
MCV: 88 fL (ref 79–97)
Platelets: 192 10*3/uL (ref 150–450)
RBC: 5.21 x10E6/uL (ref 4.14–5.80)
RDW: 13.9 % (ref 11.6–15.4)
WBC: 7.5 10*3/uL (ref 3.4–10.8)

## 2020-08-22 LAB — URIC ACID: Uric Acid: 9.2 mg/dL — ABNORMAL HIGH (ref 3.8–8.4)

## 2020-08-22 LAB — BASIC METABOLIC PANEL
BUN/Creatinine Ratio: 8 — ABNORMAL LOW (ref 9–20)
BUN: 13 mg/dL (ref 6–24)
CO2: 25 mmol/L (ref 20–29)
Calcium: 10.2 mg/dL (ref 8.7–10.2)
Chloride: 98 mmol/L (ref 96–106)
Creatinine, Ser: 1.57 mg/dL — ABNORMAL HIGH (ref 0.76–1.27)
GFR calc Af Amer: 58 mL/min/{1.73_m2} — ABNORMAL LOW (ref >59)
GFR calc non Af Amer: 50 mL/min/{1.73_m2} — ABNORMAL LOW (ref >59)
Glucose: 163 mg/dL — ABNORMAL HIGH (ref 65–99)
Potassium: 4.4 mmol/L (ref 3.5–5.2)
Sodium: 141 mmol/L (ref 134–144)

## 2020-08-22 NOTE — Progress Notes (Signed)
If we could call Jason Daugherty -  Labs are back.   CBC improved.   Kidney function still mildly reduced - nothing major, but it means we can't go up on the allopurinol at this time. He needs to adjust his diet, as his Uric Acid has increased, putting him at a greater risk for a gout flare.  Thanks,  Jari Sportsman, NP

## 2020-08-23 ENCOUNTER — Other Ambulatory Visit: Payer: Self-pay | Admitting: Registered Nurse

## 2020-08-23 ENCOUNTER — Telehealth: Payer: Self-pay | Admitting: Registered Nurse

## 2020-08-23 DIAGNOSIS — E7800 Pure hypercholesterolemia, unspecified: Secondary | ICD-10-CM

## 2020-08-23 MED ORDER — ATORVASTATIN CALCIUM 10 MG PO TABS: 10.0000 mg | ORAL_TABLET | Freq: Every day | ORAL | 3 refills | Status: DC

## 2020-08-23 NOTE — Telephone Encounter (Signed)
Patient was seen just yesterday / and was given scripts  atorvastatin (LIPITOR) 10 MG tablet [[id number] And lisinopril (ZESTRIL) 2.5 MG tablet [[id number] And nitroGLYCERIN (NITROSTAT) 0.4 MG SL tablet [[id number]]   Patient like to make sure that he gets the generic form of Lipitor  Because pharmacy tells him its over  300 dollars , with the highest being the Lipitor ,  Before single care card   Can  he recommend something more affordable .   Needs generic sent for Lipitor

## 2020-08-23 NOTE — Telephone Encounter (Signed)
Jason Daugherty has been notified of this and stated that he will send another script in for pt.

## 2020-09-04 ENCOUNTER — Other Ambulatory Visit: Payer: Self-pay | Admitting: Registered Nurse

## 2020-09-04 DIAGNOSIS — E790 Hyperuricemia without signs of inflammatory arthritis and tophaceous disease: Secondary | ICD-10-CM
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
## 2020-09-27 ENCOUNTER — Other Ambulatory Visit: Payer: Self-pay | Admitting: Family Medicine

## 2020-09-27 DIAGNOSIS — E790 Hyperuricemia without signs of inflammatory arthritis and tophaceous disease: Secondary | ICD-10-CM

## 2020-11-06 ENCOUNTER — Other Ambulatory Visit: Payer: Self-pay | Admitting: Registered Nurse

## 2020-11-06 DIAGNOSIS — E790 Hyperuricemia without signs of inflammatory arthritis and tophaceous disease: Secondary | ICD-10-CM

## 2020-11-06 MED ORDER — ALLOPURINOL 100 MG PO TABS: 100.0000 mg | ORAL_TABLET | Freq: Every day | ORAL | 2 refills | Status: DC

## 2020-11-06 NOTE — Telephone Encounter (Signed)
PT need a refill  allopurinol (ZYLOPRIM) 100 MG tablet [[id number]]  [id number] - [place], [place] - [address]  [address] [place] [address]  Phone: [phone] Fax: [phone]

## 2021-04-02 ENCOUNTER — Other Ambulatory Visit: Payer: Self-pay | Admitting: Physician Assistant

## 2021-04-02 DIAGNOSIS — E790 Hyperuricemia without signs of inflammatory arthritis and tophaceous disease: Secondary | ICD-10-CM

## 2021-04-21 ENCOUNTER — Other Ambulatory Visit: Payer: Self-pay | Admitting: Physician Assistant

## 2021-04-21 DIAGNOSIS — E790 Hyperuricemia without signs of inflammatory arthritis and tophaceous disease: Secondary | ICD-10-CM

## 2021-06-16 ENCOUNTER — Other Ambulatory Visit: Payer: Self-pay | Admitting: Physician Assistant

## 2021-06-16 DIAGNOSIS — E790 Hyperuricemia without signs of inflammatory arthritis and tophaceous disease: Secondary | ICD-10-CM

## 2021-06-28 ENCOUNTER — Other Ambulatory Visit: Payer: Self-pay | Admitting: Registered Nurse

## 2021-06-28 DIAGNOSIS — R7989 Other specified abnormal findings of blood chemistry: Secondary | ICD-10-CM

## 2021-06-28 DIAGNOSIS — E785 Hyperlipidemia, unspecified: Secondary | ICD-10-CM

## 2021-06-28 DIAGNOSIS — I2510 Atherosclerotic heart disease of native coronary artery without angina pectoris: Secondary | ICD-10-CM

## 2021-06-28 DIAGNOSIS — R945 Abnormal results of liver function studies: Secondary | ICD-10-CM

## 2021-06-28 DIAGNOSIS — I10 Essential (primary) hypertension: Secondary | ICD-10-CM

## 2024-09-26 ENCOUNTER — Other Ambulatory Visit: Payer: Self-pay

## 2024-09-26 ENCOUNTER — Ambulatory Visit
Admission: EM | Admit: 2024-09-26 | Discharge: 2024-09-26 | Disposition: A | Discharge status: Home / Self Care | Attending: Nurse Practitioner | Admitting: Nurse Practitioner

## 2024-09-26 ENCOUNTER — Ambulatory Visit: Payer: Self-pay

## 2024-09-26 DIAGNOSIS — R000 Tachycardia, unspecified: Secondary | ICD-10-CM | POA: Diagnosis not present

## 2024-09-26 DIAGNOSIS — M2550 Pain in unspecified joint: Secondary | ICD-10-CM

## 2024-09-26 LAB — POC SOFIA SARS ANTIGEN FIA: SARS Coronavirus 2 Ag: NEGATIVE

## 2024-09-26 NOTE — ED Triage Notes (Signed)
 Pt reports he has body aches mainly in his legs x 1 week. States his hands and elbows are painful as well. States he has fluid in his right knee. Worse on right side of the body   Pcp appointment 11/12

## 2024-09-26 NOTE — Discharge Instructions (Signed)
Please go to the emergency department for further evaluation.

## 2024-09-26 NOTE — ED Provider Notes (Signed)
 [facility]    CSN: [id number] Arrival date & time: 09/26/24  1337      History   Chief Complaint No chief complaint on file.   HPI Jason Daugherty. is a 58 y.o. male.   The history is provided by the patient.   Patient presents with a 1 week history of generalized bodyaches, lower extremity joint pain, and joint swelling.  Patient states that he also has fluid in the right knee.  He states that his pain has gotten to the point where he is unable to walk.  He states it took me 2 hours to get dressed, up to this clinic.  Patient reports history of MI.  He also reports underlying history of chronic kidney disease, gout, and hypertension.  Patient reports that he has not seen his PCP in quite some time, but states he is scheduled to establish care with a new PCP next month.  He denies chest pain, shortness of breath, difficulty breathing, abdominal pain, nausea, vomiting, injury, or falls.  Patient states that the last time he had symptoms similar to this nature, he had COVID.  States he has been taking over-the-counter analgesics for his symptoms.  Patient noted to be tachycardic during triage.  Past Medical History:  Diagnosis Date   CAD (coronary artery disease)    a. s/p Ant. STEMI 03/17/11 tx with Promus DES to LAD;  b. cath 03/17/11: LAD 95% (PCI), then 50%, D2 70%;  c. echo 03/17/11: EF 40-45%, septal, apical, distal Ant AK, mod LVH   CKD (chronic kidney disease)    Dyslipidemia    GERD (gastroesophageal reflux disease)    Gout    Heart attack (HCC) 04/06/2013   HTN (hypertension)    Hyperglycemia    a. A1C 5.8 (4/12)   Hypertension     Patient Active Problem List   Diagnosis Date Noted   Abnormal LFTs 01/13/2012   CAD (coronary artery disease)    Hyperglycemia    Dyslipidemia    HTN (hypertension)    Gout    GERD (gastroesophageal reflux disease)    CKD (chronic kidney disease)     Past Surgical History:  Procedure Laterality Date   EYE SURGERY      as a child   EYE SURGERY N/A    [employer] 08/18/2020   stint  2016       Home Medications    Prior to Admission medications   Medication Sig Start Date End Date Taking? Authorizing Provider  allopurinol  (ZYLOPRIM ) 100 MG tablet Take 1 tablet (100 mg total) by mouth daily. 11/06/20   Vivienne Delon HERO, PA-C  aspirin 81 MG tablet Take 81 mg by mouth daily.      [provider]  atorvastatin  (LIPITOR) 10 MG tablet Take 1 tablet (10 mg total) by mouth daily. 08/23/20   Kip Ade, NP  CHERRY PO Take by mouth daily.      [provider]  colchicine  0.6 MG tablet Take 2 pills initially, then 1 every 12 hours as needed for gout. 05/16/20   Tish Alm DEL, MD  famotidine (PEPCID AC) 10 MG tablet Take 1 tablet (10 mg total) by mouth as needed for heartburn. 03/14/12   Morris Debby BIRCH, MD  lisinopril  (ZESTRIL ) 2.5 MG tablet Take 2.5 mg by mouth daily.    [provider]  lisinopril  (ZESTRIL ) 2.5 MG tablet TAKE 1 TABLET BY MOUTH EVERY DAY 06/30/21   Kip Ade, NP  metoprolol  tartrate (LOPRESSOR ) 25 MG  tablet Take 1 tablet (25 mg total) by mouth 2 (two) times daily. 10/19/12   Morris Debby BIRCH, MD  nitroGLYCERIN  (NITROSTAT ) 0.4 MG SL tablet Place 1 tablet (0.4 mg total) under the tongue every 5 (five) minutes as needed. 08/21/20   Kip Ade, NP  predniSONE  (DELTASONE ) 20 MG tablet Take 3 pills daily for 3 days, then 2 daily for 3 days, then 1 daily for 3 days for gout 05/16/20   Tish Alm DEL, MD    Family History Family History  Problem Relation Age of Onset   Hyperlipidemia Mother    Heart disease Father    Coronary artery disease Father    Heart attack Father 29   Heart murmur Father     Social History Social History   Tobacco Use   Smoking status: Never   Smokeless tobacco: Never  Vaping Use   Vaping status: Never Used  Substance Use Topics   Alcohol use: Not Currently   Drug use: Not Currently     Allergies   Patient has no  known allergies.   Review of Systems Review of Systems Per HPI  Physical Exam Triage Vital Signs ED Triage Vitals  Encounter Vitals Group     BP 09/26/24 1358 (!) 141/98     Girls Systolic BP Percentile --      Girls Diastolic BP Percentile --      Boys Systolic BP Percentile --      Boys Diastolic BP Percentile --      Pulse Rate 09/26/24 1358 (!) 131     Resp 09/26/24 1358 20     Temp 09/26/24 1358 98.5 F (36.9 C)     Temp Source 09/26/24 1358 Oral     SpO2 09/26/24 1358 97 %     Weight --      Height --      Head Circumference --      Peak Flow --      Pain Score 09/26/24 1400 6     Pain Loc --      Pain Education --      Exclude from Growth Chart --    No data found.  Updated Vital Signs BP (!) 141/98 (BP Location: Right Arm)   Pulse (!) 131   Temp 98.5 F (36.9 C) (Oral)   Resp 20   SpO2 97%   Visual Acuity Right Eye Distance:   Left Eye Distance:   Bilateral Distance:    Right Eye Near:   Left Eye Near:    Bilateral Near:     Physical Exam Vitals and nursing note reviewed.  Constitutional:      General: He is not in acute distress.    Appearance: Normal appearance.  HENT:     Head: Normocephalic.     Nose: Nose normal.     Mouth/Throat:     Mouth: Mucous membranes are moist.  Eyes:     Extraocular Movements: Extraocular movements intact.     Pupils: Pupils are equal, round, and reactive to light.  Cardiovascular:     Rate and Rhythm: Regular rhythm. Tachycardia present.     Pulses: Normal pulses.     Heart sounds: Normal heart sounds.  Pulmonary:     Effort: Pulmonary effort is normal. No respiratory distress.     Breath sounds: Normal breath sounds. No stridor. No wheezing, rhonchi or rales.  Abdominal:     General: Bowel sounds are normal.     Palpations: Abdomen is soft.  Musculoskeletal:     Cervical back: Normal range of motion.     Right knee: Swelling present.     Left knee: Swelling present.  Skin:    General: Skin is warm  and dry.  Neurological:     General: No focal deficit present.     Mental Status: He is alert and oriented to person, place, and time.  Psychiatric:        Mood and Affect: Mood normal.        Behavior: Behavior normal.      UC Treatments / Results  Labs (all labs ordered are listed, but only abnormal results are displayed) Labs Reviewed  POC SOFIA SARS ANTIGEN FIA - Normal    EKG: Sinus tachycardia with PVCs, no STEMI.  Compared to EKGs dated 01/25/2013, 10/19/2012, and 03/14/2012.    Radiology No results found.  Procedures Procedures (including critical care time)  Medications Ordered in UC Medications - No data to display  Initial Impression / Assessment and Plan / UC Course  I have reviewed the triage vital signs and the nursing notes.  Pertinent labs & imaging results that were available during my care of the patient were reviewed by me and considered in my medical decision making (see chart for details).  Patient presents with complaints of generalized joint pain, swelling, and tachycardia.  Patient was initially wheeled into the exam room, stating that he has been unable to ambulate.  He also presents with a cane.  EKG was performed which showed sinus tachycardia with PVCs.  Patient denies chest pain, shortness of breath, or difficulty breathing.  Patient reports that he has a history of MI.  Per review of his chart, he also has underlying history of chronic kidney disease, gout, and hypertension.  Given the patient's decreased mobility and new onset tachycardia, patient was advised it is recommended that he follow-up in the emergency department for further evaluation.  COVID test was completed which was negative.  Patient was in agreement with this plan of care and verbalized understanding.  Patient discharged to the emergency department.  The patient was ambulatory w/cane assist at discharge.   Final Clinical Impressions(s) / UC Diagnoses   Final diagnoses:   Arthralgia, unspecified joint   Discharge Instructions   None    ED Prescriptions   None    PDMP not reviewed this encounter.   Gilmer Etta PARAS, NP 09/26/24 [phone]

## 2024-09-26 NOTE — Telephone Encounter (Signed)
 FYI Only or Action Required?: FYI only for provider.  Patient was last seen in primary care on not established.  Called Nurse Triage reporting Joint Swelling.  Symptoms began several days ago.  Interventions attempted: OTC medications: Ibuprofen.  Symptoms are: gradually worsening.  Triage Disposition: See HCP Within 4 Hours (Or PCP Triage)  Patient/caregiver understands and will follow disposition?: Unsure  Copied from CRM #[id number]. Topic: Clinical - Red Word Triage >> Sep 26, 2024 10:49 AM Myrick T wrote: Red Word that prompted transfer to Nurse Triage: patient stated he is in a lot of pain in his joints. Having extreme difficulty walking. He states the last time he was diagnosed with Covid it affected his joints to where he was almost paralyzed by pain. His knees are swollen.  Reason for Disposition  [1] SEVERE pain (e.g., excruciating, unable to do any normal activities) AND [2] not improved after 2 hours of pain medicine  Answer Assessment - Initial Assessment Questions Pt reports it feels like his legs are wooden and painful, right more than the left. Had Covid in 2022 and felt similar pain that pt is experiencing now. Has not taken a covid test. Unable to wear regular shoes and having difficulty walking and getting dressed, currently ambulating with a cane. Pt not established with PCP and unable to schedule for acute visit in the next 4 hours. Advised UC, pt reports he may be able to have someone take him today, and if not then would go tomorrow. Advised to go to UC sooner or to ED for worsening symptoms in the meantime. NP appt at [facility] scheduled for soonest available appt on 10/18/24 per pt request.  1. ONSET: When did the pain start?      Thursday  2. LOCATION: Where is the pain located?      Right knee, right great toe, entire right leg from calf up to thigh pain in soft tissue. Radiates to left leg. Redness and swelling in right great toe.  3. PAIN: How bad is the  pain?    (Scale 1-10; or mild, moderate, severe)     12-15/10 per pt. Taking ibuprofen. Reports taking 4 tabs and sometimes more at one time every 8 hours. 200 mg tabs. Discussed with pt that 200-400 mg at one time every 4-6 hours is the recommended dose of ibuprofen to take to avoid organ damage. Advised reducing dose and alternating with tylenol.  4. WORK OR EXERCISE: Has there been any recent work or exercise that involved this part of the body?      No  5. CAUSE: What do you think is causing the leg pain?     Possibly Covid. Also has gout.  6. OTHER SYMPTOMS: Do you have any other symptoms? (e.g., chest pain, back pain, breathing difficulty, swelling, rash, fever, numbness, weakness)     Sore all over body. Hasn't checked temperature but doesn't feel feverish. No CP. Reports intermittent mild chest tightness for 2-3 seconds over past few years. Reports mild SOB.  Protocols used: Leg Pain-A-AH

## 2024-09-26 NOTE — ED Notes (Signed)
 Patient is being discharged from the Urgent Care and sent to the Emergency Department via private vehicle . Per NP, patient is in need of higher level of care due to joint pain and increased heart rate. Patient is aware and verbalizes understanding of plan of care.  Vitals:   09/26/24 1358  BP: (!) 141/98  Pulse: (!) 131  Resp: 20  Temp: 98.5 F (36.9 C)  SpO2: 97%

## 2024-09-27 ENCOUNTER — Emergency Department (HOSPITAL_COMMUNITY)

## 2024-09-27 ENCOUNTER — Other Ambulatory Visit: Payer: Self-pay

## 2024-09-27 ENCOUNTER — Encounter (HOSPITAL_COMMUNITY): Payer: Self-pay

## 2024-09-27 ENCOUNTER — Emergency Department (HOSPITAL_COMMUNITY): Admission: EM | Admit: 2024-09-27 | Discharge: 2024-09-27 | Disposition: A | Discharge status: Home / Self Care

## 2024-09-27 DIAGNOSIS — Z7982 Long term (current) use of aspirin: Secondary | ICD-10-CM | POA: Diagnosis not present

## 2024-09-27 DIAGNOSIS — I129 Hypertensive chronic kidney disease with stage 1 through stage 4 chronic kidney disease, or unspecified chronic kidney disease: Secondary | ICD-10-CM | POA: Diagnosis not present

## 2024-09-27 DIAGNOSIS — M79674 Pain in right toe(s): Secondary | ICD-10-CM | POA: Insufficient documentation

## 2024-09-27 DIAGNOSIS — N189 Chronic kidney disease, unspecified: Secondary | ICD-10-CM | POA: Insufficient documentation

## 2024-09-27 DIAGNOSIS — Z79899 Other long term (current) drug therapy: Secondary | ICD-10-CM | POA: Insufficient documentation

## 2024-09-27 DIAGNOSIS — M25561 Pain in right knee: Secondary | ICD-10-CM | POA: Insufficient documentation

## 2024-09-27 DIAGNOSIS — M79661 Pain in right lower leg: Secondary | ICD-10-CM | POA: Diagnosis not present

## 2024-09-27 LAB — CBC WITH DIFFERENTIAL/PLATELET
Abs Immature Granulocytes: 0.04 K/uL (ref 0.00–0.07)
Basophils Absolute: 0 K/uL (ref 0.0–0.1)
Basophils Relative: 0 %
Eosinophils Absolute: 0 K/uL (ref 0.0–0.5)
Eosinophils Relative: 0 %
HCT: 39.6 % (ref 39.0–52.0)
Hemoglobin: 14 g/dL (ref 13.0–17.0)
Immature Granulocytes: 0 %
Lymphocytes Relative: 11 %
Lymphs Abs: 1.5 K/uL (ref 0.7–4.0)
MCH: 29.9 pg (ref 26.0–34.0)
MCHC: 35.4 g/dL (ref 30.0–36.0)
MCV: 84.4 fL (ref 80.0–100.0)
Monocytes Absolute: 1.1 K/uL — ABNORMAL HIGH (ref 0.1–1.0)
Monocytes Relative: 8 %
Neutro Abs: 11.2 K/uL — ABNORMAL HIGH (ref 1.7–7.7)
Neutrophils Relative %: 81 %
Platelets: 256 K/uL (ref 150–400)
RBC: 4.69 MIL/uL (ref 4.22–5.81)
RDW: 12.6 % (ref 11.5–15.5)
WBC: 13.8 K/uL — ABNORMAL HIGH (ref 4.0–10.5)
nRBC: 0 % (ref 0.0–0.2)

## 2024-09-27 LAB — BASIC METABOLIC PANEL WITH GFR
Anion gap: 17 — ABNORMAL HIGH (ref 5–15)
BUN: 22 mg/dL — ABNORMAL HIGH (ref 6–20)
CO2: 23 mmol/L (ref 22–32)
Calcium: 9.7 mg/dL (ref 8.9–10.3)
Chloride: 97 mmol/L — ABNORMAL LOW (ref 98–111)
Creatinine, Ser: 1.54 mg/dL — ABNORMAL HIGH (ref 0.61–1.24)
GFR, Estimated: 53 mL/min — ABNORMAL LOW (ref >60)
Glucose, Bld: 141 mg/dL — ABNORMAL HIGH (ref 70–99)
Potassium: 3.3 mmol/L — ABNORMAL LOW (ref 3.5–5.1)
Sodium: 136 mmol/L (ref 135–145)

## 2024-09-27 LAB — D-DIMER, QUANTITATIVE: D-Dimer, Quant: 1.05 ug{FEU}/mL — ABNORMAL HIGH (ref 0.00–0.50)

## 2024-09-27 LAB — URIC ACID: Uric Acid, Serum: 9.9 mg/dL — ABNORMAL HIGH (ref 3.7–8.6)

## 2024-09-27 MED ORDER — METHOCARBAMOL 750 MG PO TABS: 750.0000 mg | ORAL_TABLET | Freq: Three times a day (TID) | ORAL | 0 refills | Status: AC | PRN

## 2024-09-27 MED ORDER — COLCHICINE 0.6 MG PO TABS
0.6000 mg | ORAL_TABLET | Freq: Once | ORAL | Status: AC
  Administered 2024-09-27: 0.6 mg via ORAL
  Filled 2024-09-27: qty 1

## 2024-09-27 MED ORDER — ONDANSETRON HCL 4 MG PO TABS: 4.0000 mg | ORAL_TABLET | Freq: Four times a day (QID) | ORAL | 0 refills | Status: AC

## 2024-09-27 MED ORDER — METHOCARBAMOL 500 MG PO TABS
750.0000 mg | ORAL_TABLET | Freq: Once | ORAL | Status: AC
  Administered 2024-09-27: 750 mg via ORAL
  Filled 2024-09-27: qty 2

## 2024-09-27 MED ORDER — HYDROCODONE-ACETAMINOPHEN 5-325 MG PO TABS: 1.0000 | ORAL_TABLET | Freq: Four times a day (QID) | ORAL | 0 refills | Status: AC | PRN

## 2024-09-27 MED ORDER — HYDROCODONE-ACETAMINOPHEN 5-325 MG PO TABS
1.0000 | ORAL_TABLET | Freq: Once | ORAL | Status: AC
  Administered 2024-09-27: 1 via ORAL
  Filled 2024-09-27: qty 1

## 2024-09-27 MED ORDER — PREDNISONE 10 MG PO TABS: ORAL_TABLET | ORAL | 0 refills | Status: AC

## 2024-09-27 MED ORDER — COLCHICINE 0.6 MG PO TABS
1.2000 mg | ORAL_TABLET | Freq: Once | ORAL | Status: AC
  Administered 2024-09-27: 1.2 mg via ORAL
  Filled 2024-09-27: qty 2

## 2024-09-27 NOTE — ED Notes (Signed)
 US  at bedside.

## 2024-09-27 NOTE — ED Provider Notes (Signed)
 [place] EM[facility] AT [facility] Provider Note   CSN: [id number] Arrival date & time: 09/27/24  1303     Patient presents with: Knee Pain   Jason Daugherty. is a 58 y.o. male.  {Add pertinent medical, surgical, social history, OB history to YEP:67052} The history is provided by the patient.       Prior to Admission medications   Medication Sig Start Date End Date Taking? Authorizing Provider  allopurinol  (ZYLOPRIM ) 100 MG tablet Take 1 tablet (100 mg total) by mouth daily. 11/06/20   Vivienne Delon HERO, PA-C  aspirin 81 MG tablet Take 81 mg by mouth daily.      [provider]  atorvastatin  (LIPITOR) 10 MG tablet Take 1 tablet (10 mg total) by mouth daily. 08/23/20   Kip Ade, NP  CHERRY PO Take by mouth daily.      [provider]  colchicine  0.6 MG tablet Take 2 pills initially, then 1 every 12 hours as needed for gout. 05/16/20   Tish Alm DEL, MD  famotidine (PEPCID AC) 10 MG tablet Take 1 tablet (10 mg total) by mouth as needed for heartburn. 03/14/12   Morris Debby BIRCH, MD  lisinopril  (ZESTRIL ) 2.5 MG tablet Take 2.5 mg by mouth daily.    [provider]  lisinopril  (ZESTRIL ) 2.5 MG tablet TAKE 1 TABLET BY MOUTH EVERY DAY 06/30/21   Kip Ade, NP  metoprolol  tartrate (LOPRESSOR ) 25 MG tablet Take 1 tablet (25 mg total) by mouth 2 (two) times daily. 10/19/12   Morris Debby BIRCH, MD  nitroGLYCERIN  (NITROSTAT ) 0.4 MG SL tablet Place 1 tablet (0.4 mg total) under the tongue every 5 (five) minutes as needed. 08/21/20   Kip Ade, NP  predniSONE  (DELTASONE ) 20 MG tablet Take 3 pills daily for 3 days, then 2 daily for 3 days, then 1 daily for 3 days for gout 05/16/20   Tish Alm DEL, MD    Allergies: Patient has no known allergies.    Review of Systems  Updated Vital Signs BP 117/71 (BP Location: Right Arm)   Pulse 65   Temp 98.6 F (37 C) (Oral)   Resp 14   Ht 5' 9 (1.753 m)   Wt 81.7 kg   SpO2 97%    BMI 26.60 kg/m   Physical Exam  (all labs ordered are listed, but only abnormal results are displayed) Labs Reviewed  CBC WITH DIFFERENTIAL/PLATELET - Abnormal; Notable for the following components:      Result Value   WBC 13.8 (*)    Neutro Abs 11.2 (*)    Monocytes Absolute 1.1 (*)    All other components within normal limits  BASIC METABOLIC PANEL WITH GFR - Abnormal; Notable for the following components:   Potassium 3.3 (*)    Chloride 97 (*)    Glucose, Bld 141 (*)    BUN 22 (*)    Creatinine, Ser 1.54 (*)    GFR, Estimated 53 (*)    Anion gap 17 (*)    All other components within normal limits  URIC ACID  D-DIMER, QUANTITATIVE    EKG: None  Radiology: DG Toe Great Right Result Date: 09/27/2024 EXAM: VIEW(S) XRAY OF THE TOES 09/27/2024 01:49:00 PM COMPARISON: None available. CLINICAL HISTORY: swelling, pain. Pt states bad gout pain traveling up leg. Pt unable to bend knee or move toe. 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 The distal phalanx is subluxed medially. There is surrounding exophytic spur formation about the ankylosed joint space without signs of underlying acute osseous erosion. Diffuse soft tissue swelling about the great toe noted. No acute fracture. SOFT TISSUES: Diffuse soft tissue swelling. IMPRESSION: 1. Diffuse soft tissue swelling about the great toe. 2. 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 The distal phalanx is subluxed medially. Electronically signed by: Waddell Calk MD 09/27/2024 02:17 PM EDT RP Workstation: [id number]   DG Knee Complete 4 Views Right Result Date: 09/27/2024 EXAM: 4 OR MORE VIEW(S) XRAY OF THE KNEE 09/27/2024 01:49:00 PM COMPARISON: None available. CLINICAL HISTORY:  swelling, pain. Pt states bad gout pain traveling up leg. Pt unable to bend knee or move toe. Best attainable toe images sent FINDINGS: BONES AND JOINTS: No acute fracture. No focal osseous lesion. No joint dislocation. Small suprapatellar knee joint effusion. No significant degenerative changes. SOFT TISSUES: The soft tissues are unremarkable. IMPRESSION: 1. Small suprapatellar knee joint effusion. Electronically signed by: Waddell Calk MD 09/27/2024 02:10 PM EDT RP Workstation: [id number]    {Document cardiac monitor, telemetry assessment procedure when appropriate:32947} Procedures   Medications Ordered in the ED - No data to display    {Click here for ABCD2, HEART and other calculators REFRESH Note before signing:1}                              Medical Decision Making Amount and/or Complexity of Data Reviewed Labs: ordered.   ***  {Document critical care time when appropriate  Document review of labs and clinical decision tools ie CHADS2VASC2, etc  Document your independent review of radiology images and any outside records  Document your discussion with family members, caretakers and with consultants  Document social determinants of health affecting pt's care  Document your decision making why or why not admission, treatments were needed:32947:::1}   Final diagnoses:  None    ED Discharge Orders     None

## 2024-09-27 NOTE — ED Notes (Signed)
 Patient transported to X-ray

## 2024-09-27 NOTE — ED Provider Triage Note (Signed)
 Emergency Medicine Provider Triage Evaluation Note  Jason Daugherty. , a 58 y.o. male  was evaluated in triage.  Pt complains of right knee pain right calf pain which extends into his right MTP joint of his great toe.  He states the toe pain is consistent with a gouty flare but is having severe pain in the knee and inability to bend his knee which is not typical of gout.  He also describes a charley horse type sensation in his right calf.  He denies injuries or falls, he has had no swelling in the lower extremity but does feel his knee is more swollen than normal.  Denies fevers, denies chest pain or shortness of breath.  He used to be on allopurinol  and colchicine  but has not had a primary provider in years and no longer has his medications.  Review of Systems  Positive: Right lower extremity pain from knee to great toe including calf pain, pain with right knee flexion. Negative: Fevers, erythema, numbness, weakness  Physical Exam  BP 117/71 (BP Location: Right Arm)   Pulse 65   Temp 98.6 F (37 C) (Oral)   Resp 14   Ht 5' 9 (1.753 m)   Wt 81.7 kg   SpO2 97%   BMI 26.60 kg/m  Gen:   Awake, no distress   Resp:  Normal effort  MSK:   Holding right knee in near extension.  Tender to palpation right upper calf, there are no palpable cords, he has no lower extremity edema.   Other:    Medical Decision Making  Medically screening exam initiated at 2:43 PM.  Appropriate orders placed.  Jason Timotheus Salm. was informed that the remainder of the evaluation will be completed by another provider, this initial triage assessment does not replace that evaluation, and the importance of remaining in the ED until their evaluation is complete.     Birdena Clarity, PA-C 09/27/24 1448

## 2024-09-27 NOTE — ED Triage Notes (Signed)
 Pt arrived via [facility] from home c/o possible gout flare up. Pt reports swell, burning and throbbing pain in his right knee and right great toe. Pt reports going to Urgent Care yesterday and they didn't do anything for him.

## 2024-09-27 NOTE — Discharge Instructions (Signed)
 Take the medications prescribed, I have decided on a prednisone  taper I think this will give you the best relief from your gout flareup.  I also prescribed you some nausea medicine as you said it does cause some upset for you.  Of also prescribed you a few hydrocodone tablets, use these sparingly and be aware that they can make you sleepy and you should not drive within 4 hours of taking this medication.  You should also apply a gentle heating pad to your knee for 20 minutes 3 times daily.  I am referring you to our local orthopedist for follow-up care if your symptoms are not improving with this treatment plan.  However in the interim if you determined you are having increased symptoms, pain, swelling or redness you should return here for reevaluation.

## 2024-10-18 ENCOUNTER — Ambulatory Visit: Payer: Self-pay | Admitting: Family Medicine

## 2024-10-18 VITALS — BP 119/75 | HR 78 | Temp 98.3°F | Ht 69.0 in | Wt 184.0 lb

## 2024-10-18 DIAGNOSIS — Z7985 Long-term (current) use of injectable non-insulin antidiabetic drugs: Secondary | ICD-10-CM

## 2024-10-18 DIAGNOSIS — I2510 Atherosclerotic heart disease of native coronary artery without angina pectoris: Secondary | ICD-10-CM

## 2024-10-18 DIAGNOSIS — Z130 Encounter for screening for diseases of the blood and blood-forming organs and certain disorders involving the immune mechanism: Secondary | ICD-10-CM

## 2024-10-18 DIAGNOSIS — Z8739 Personal history of other diseases of the musculoskeletal system and connective tissue: Secondary | ICD-10-CM

## 2024-10-18 DIAGNOSIS — Z23 Encounter for immunization: Secondary | ICD-10-CM | POA: Diagnosis not present

## 2024-10-18 DIAGNOSIS — N1831 Chronic kidney disease, stage 3a: Secondary | ICD-10-CM | POA: Diagnosis not present

## 2024-10-18 DIAGNOSIS — M25561 Pain in right knee: Secondary | ICD-10-CM

## 2024-10-18 DIAGNOSIS — R7989 Other specified abnormal findings of blood chemistry: Secondary | ICD-10-CM

## 2024-10-18 DIAGNOSIS — I252 Old myocardial infarction: Secondary | ICD-10-CM

## 2024-10-18 DIAGNOSIS — E119 Type 2 diabetes mellitus without complications: Secondary | ICD-10-CM

## 2024-10-18 DIAGNOSIS — I10 Essential (primary) hypertension: Secondary | ICD-10-CM

## 2024-10-18 DIAGNOSIS — Z136 Encounter for screening for cardiovascular disorders: Secondary | ICD-10-CM

## 2024-10-18 LAB — BAYER DCA HB A1C WAIVED: HB A1C (BAYER DCA - WAIVED): 6.9 % — ABNORMAL HIGH (ref 4.8–5.6)

## 2024-10-18 MED ORDER — TRULICITY 0.75 MG/0.5ML ~~LOC~~ SOAJ: 0.7500 mg | SUBCUTANEOUS | 1 refills | Status: AC

## 2024-10-18 MED ORDER — ALLOPURINOL 100 MG PO TABS: 100.0000 mg | ORAL_TABLET | Freq: Every day | ORAL | 0 refills | Status: DC

## 2024-10-18 MED ORDER — PREDNISONE 20 MG PO TABS: 40.0000 mg | ORAL_TABLET | Freq: Every day | ORAL | 0 refills | Status: AC

## 2024-10-18 NOTE — Progress Notes (Signed)
 New Patient Office Visit  Patient ID: Jason Daugherty., Male   DOB: [birth year] 58 y.o. MRN: [id number]  Chief Complaint  Patient presents with   Establish Care   Muscle Soreness    Patient says he had a gout flare up a few weeks ago and ever since then, he has had a lot of soreness in his legs, in the muscles.   Subjective:     Jason Daugherty. presents to establish care  HPI  Discussed the use of AI scribe software for clinical note transcription with the patient, who gave verbal consent to proceed.  History of Present Illness   Jason Daugherty. is a 58 year old male with gout who presents for establishment of care.  Hx of gout - Recent ED visit on 09/27/24 for gout flare, treated with steroids - ED reports that they placed ortho referral but the patient has not heard from them.  - Currently taking allopurinol  100 mg daily for gout management - No alcohol or significant red meat consumption, aware of dietary triggers for gout   Hx of MI - MI 2012 with stent placement - Patient denies any cardiac symptoms - Has not seen cardiology in many years - Takes 81mg /day aspirin    Antihypertensive therapy - Currently taking lisinopril  for blood pressure management   Other medications - Pepcid used as needed - Occasional use of Prilosec       Outpatient Encounter Medications as of 10/18/2024  Medication Sig   aspirin 81 MG tablet Take 81 mg by mouth daily.     CHERRY PO Take by mouth daily.     colchicine  0.6 MG tablet Take 2 pills initially, then 1 every 12 hours as needed for gout.   Dulaglutide (TRULICITY) 0.75 MG/0.5ML SOAJ Inject 0.75 mg into the skin once a week for 8 doses.   famotidine (PEPCID AC) 10 MG tablet Take 1 tablet (10 mg total) by mouth as needed for heartburn.   HYDROcodone-acetaminophen (NORCO/VICODIN) 5-325 MG tablet Take 1 tablet by mouth every 6 (six) hours as needed for severe pain (pain score 7-10).   lisinopril  (ZESTRIL ) 2.5 MG tablet Take  2.5 mg by mouth daily.   methocarbamol (ROBAXIN) 750 MG tablet Take 1 tablet (750 mg total) by mouth every 8 (eight) hours as needed for muscle spasms.   metoprolol  tartrate (LOPRESSOR ) 25 MG tablet Take 1 tablet (25 mg total) by mouth 2 (two) times daily.   nitroGLYCERIN  (NITROSTAT ) 0.4 MG SL tablet Place 1 tablet (0.4 mg total) under the tongue every 5 (five) minutes as needed.   ondansetron (ZOFRAN) 4 MG tablet Take 1 tablet (4 mg total) by mouth every 6 (six) hours.   predniSONE  (DELTASONE ) 10 MG tablet 6, 5, 4, 3, 2 then 1 tablet by mouth daily for 6 days total.   predniSONE  (DELTASONE ) 20 MG tablet Take 2 tablets (40 mg total) by mouth daily with breakfast for 5 days. Take medication for gout flare and follow up with PCP at onset of symptoms   [DISCONTINUED] allopurinol  (ZYLOPRIM ) 100 MG tablet Take 1 tablet (100 mg total) by mouth daily.   [DISCONTINUED] atorvastatin  (LIPITOR) 10 MG tablet Take 1 tablet (10 mg total) by mouth daily.   allopurinol  (ZYLOPRIM ) 100 MG tablet Take 1 tablet (100 mg total) by mouth daily.   [DISCONTINUED] lisinopril  (ZESTRIL ) 2.5 MG tablet TAKE 1 TABLET BY MOUTH EVERY DAY   No facility-administered encounter medications on file as of 10/18/2024.    Past Medical  History:  Diagnosis Date   CAD (coronary artery disease)    a. s/p Ant. STEMI 03/17/11 tx with Promus DES to LAD;  b. cath 03/17/11: LAD 95% (PCI), then 50%, D2 70%;  c. echo 03/17/11: EF 40-45%, septal, apical, distal Ant AK, mod LVH   CKD (chronic kidney disease)    Dyslipidemia    GERD (gastroesophageal reflux disease)    Gout    Heart attack (HCC) 04/06/2013   HTN (hypertension)    Hyperglycemia    a. A1C 5.8 (4/12)   Hypertension     Past Surgical History:  Procedure Laterality Date   EYE SURGERY     as a child   EYE SURGERY N/A    [employer] 08/18/2020   stint  2016    Family History  Problem Relation Age of Onset   Hyperlipidemia Mother    Heart disease Father    Coronary artery  disease Father    Heart attack Father 75   Heart murmur Father     Social History   Socioeconomic History   Marital status: Single    Spouse name: Not on file   Number of children: 0   Years of education: Not on file   Highest education level: Some college, no degree  Occupational History   Not on file  Tobacco Use   Smoking status: Never    Passive exposure: Never   Smokeless tobacco: Never  Vaping Use   Vaping status: Never Used  Substance and Sexual Activity   Alcohol use: Not Currently   Drug use: Not Currently   Sexual activity: Not Currently  Other Topics Concern   Not on file  Social History Narrative   ** Merged History Encounter **       Social Drivers of Health   Financial Resource Strain: Low Risk  (10/14/2024)   Overall Financial Resource Strain (CARDIA)    Difficulty of Paying Living Expenses: Not very hard  Food Insecurity: No Food Insecurity (10/14/2024)   Hunger Vital Sign    Worried About Running Out of Food in the Last Year: Never true    Ran Out of Food in the Last Year: Never true  Transportation Needs: No Transportation Needs (10/14/2024)   PRAPARE - Administrator, Civil Service (Medical): No    Lack of Transportation (Non-Medical): No  Physical Activity: Sufficiently Active (10/14/2024)   Exercise Vital Sign    Days of Exercise per Week: 3 days    Minutes of Exercise per Session: 60 min  Stress: No Stress Concern Present (10/14/2024)   [employer] of Occupational Health - Occupational Stress Questionnaire    Feeling of Stress: Only a little  Social Connections: Socially Isolated (10/14/2024)   Social Connection and Isolation Panel    Frequency of Communication with Friends and Family: Once a week    Frequency of Social Gatherings with Friends and Family: Once a week    Attends Religious Services: 1 to 4 times per year    Active Member of [employer] or Organizations: No    Attends Engineer, Structural: Not on file     Marital Status: Never married  Intimate Partner Violence: Unknown (03/09/2022)   Received from [facility]   HITS    Physically Hurt: Not on file    Insult or Talk Down To: Not on file    Threaten Physical Harm: Not on file    Scream or Curse: Not on file    ROS  Objective:    BP 119/75   Pulse 78   Temp 98.3 F (36.8 C)   Ht 5' 9 (1.753 m)   Wt 184 lb (83.5 kg)   SpO2 98%   BMI 27.17 kg/m   Physical Exam Vitals reviewed.  Constitutional:      Appearance: Normal appearance.  HENT:     Head: Normocephalic and atraumatic.  Eyes:     Extraocular Movements: Extraocular movements intact.     Conjunctiva/sclera: Conjunctivae normal.     Pupils: Pupils are equal, round, and reactive to light.  Cardiovascular:     Rate and Rhythm: Normal rate and regular rhythm.     Pulses: Normal pulses.     Heart sounds: Normal heart sounds.  Pulmonary:     Effort: Pulmonary effort is normal. No respiratory distress.     Breath sounds: Normal breath sounds.  Musculoskeletal:        General: Normal range of motion.     Cervical back: Normal range of motion.     Comments: R knee larger than L. No fluctuance palpated. No erythema or warmth.  Skin:    General: Skin is warm and dry.     Capillary Refill: Capillary refill takes less than 2 seconds.  Neurological:     General: No focal deficit present.     Mental Status: He is alert and oriented to person, place, and time.  Psychiatric:        Mood and Affect: Mood normal.        Behavior: Behavior normal.          Assessment & Plan:   Problem List Items Addressed This Visit       Cardiovascular and Mediastinum   CAD (coronary artery disease)   HTN (hypertension)   Relevant Orders   Microalbumin/Creatinine Ratio, Urine     Genitourinary   CKD (chronic kidney disease)   Other Visit Diagnoses       Type 2 diabetes mellitus without complication, without long-term current use of insulin (HCC)    -  Primary   Relevant  Medications   Dulaglutide (TRULICITY) 0.75 MG/0.5ML SOAJ     History of MI (myocardial infarction)         Recurrent pain of right knee       Relevant Orders   AMB referral to orthopedics     History of gout       Relevant Medications   allopurinol  (ZYLOPRIM ) 100 MG tablet   predniSONE  (DELTASONE ) 20 MG tablet   Other Relevant Orders   Uric acid   AMB referral to orthopedics     Screening for endocrine, nutritional, metabolic and immunity disorder       Relevant Orders   Comprehensive metabolic panel with GFR   CBC with Differential   Bayer DCA Hb A1c Waived     Encounter for screening for cardiovascular disorders       Relevant Orders   Lipid Panel     Elevated serum creatinine       Relevant Orders   Microalbumin/Creatinine Ratio, Urine     Encounter for immunization       Relevant Orders   Flu vaccine trivalent PF, 6mos and older(Flulaval,Afluria,Fluarix,Fluzone) (Completed)       Assessment and Plan  A1C 6.9 today -hx of CAD, MI, and CKD - Start trulicity. Discussed risks and benefits. Follow up immediately for any side effects.      Hx of gout Recent flare with ED visit -  Ordered uric acid level. - Ordered orthopedic referral. - Refilled allopurinol . - Patient requested prescription for prednisone  to have for future gout flare if needed. Prednisone  ordered today, instructed to f/u with pcp if gout flare happens.   Hx of elevated serum creatinine  - labs ordered, results pending  Essential hypertension Blood pressure well-controlled at 119/75 mmHg with current regimen. - Continue lisinopril .  Old myocardial infarction, status post stent -Myocardial infarction 13 years ago with stent. No recent cardiac events. Blood pressure well-controlled.  - Not currently taking statin, will obtain lipid panel today and discuss with patient once results are available.      Return in about 3 months (around 01/18/2025).   Oneil LELON Severin, FNP [facility]

## 2024-10-19 ENCOUNTER — Ambulatory Visit: Payer: Self-pay | Admitting: Family Medicine

## 2024-10-19 ENCOUNTER — Other Ambulatory Visit: Payer: Self-pay | Admitting: Family Medicine

## 2024-10-19 DIAGNOSIS — Z8739 Personal history of other diseases of the musculoskeletal system and connective tissue: Secondary | ICD-10-CM

## 2024-10-19 DIAGNOSIS — R7989 Other specified abnormal findings of blood chemistry: Secondary | ICD-10-CM

## 2024-10-19 LAB — CBC WITH DIFFERENTIAL/PLATELET
Basophils Absolute: 0 x10E3/uL (ref 0.0–0.2)
Basos: 1 %
EOS (ABSOLUTE): 0.1 x10E3/uL (ref 0.0–0.4)
Eos: 1 %
Hematocrit: 39.2 % (ref 37.5–51.0)
Hemoglobin: 13.3 g/dL (ref 13.0–17.7)
Immature Grans (Abs): 0 x10E3/uL (ref 0.0–0.1)
Immature Granulocytes: 0 %
Lymphocytes Absolute: 2.2 x10E3/uL (ref 0.7–3.1)
Lymphs: 34 %
MCH: 30 pg (ref 26.6–33.0)
MCHC: 33.9 g/dL (ref 31.5–35.7)
MCV: 89 fL (ref 79–97)
Monocytes Absolute: 0.5 x10E3/uL (ref 0.1–0.9)
Monocytes: 7 %
Neutrophils Absolute: 3.7 x10E3/uL (ref 1.4–7.0)
Neutrophils: 57 %
Platelets: 223 x10E3/uL (ref 150–450)
RBC: 4.43 x10E6/uL (ref 4.14–5.80)
RDW: 12.3 % (ref 11.6–15.4)
WBC: 6.4 x10E3/uL (ref 3.4–10.8)

## 2024-10-19 LAB — COMPREHENSIVE METABOLIC PANEL WITH GFR
ALT: 16 IU/L (ref 0–44)
AST: 16 IU/L (ref 0–40)
Albumin: 4 g/dL (ref 3.8–4.9)
Alkaline Phosphatase: 108 IU/L (ref 47–123)
BUN/Creatinine Ratio: 8 — ABNORMAL LOW (ref 9–20)
BUN: 13 mg/dL (ref 6–24)
Bilirubin Total: 1 mg/dL (ref 0.0–1.2)
CO2: 26 mmol/L (ref 20–29)
Calcium: 8.4 mg/dL — ABNORMAL LOW (ref 8.7–10.2)
Chloride: 97 mmol/L (ref 96–106)
Creatinine, Ser: 1.66 mg/dL — ABNORMAL HIGH (ref 0.76–1.27)
Globulin, Total: 2.7 g/dL (ref 1.5–4.5)
Glucose: 128 mg/dL — ABNORMAL HIGH (ref 70–99)
Potassium: 3.7 mmol/L (ref 3.5–5.2)
Sodium: 137 mmol/L (ref 134–144)
Total Protein: 6.7 g/dL (ref 6.0–8.5)
eGFR: 48 mL/min/1.73 — ABNORMAL LOW (ref >59)

## 2024-10-19 LAB — LIPID PANEL
Chol/HDL Ratio: 4.6 ratio (ref 0.0–5.0)
Cholesterol, Total: 170 mg/dL (ref 100–199)
HDL: 37 mg/dL — ABNORMAL LOW (ref >39)
LDL Chol Calc (NIH): 119 mg/dL — ABNORMAL HIGH (ref 0–99)
Triglycerides: 72 mg/dL (ref 0–149)
VLDL Cholesterol Cal: 14 mg/dL (ref 5–40)

## 2024-10-19 LAB — MICROALBUMIN / CREATININE URINE RATIO
Creatinine, Urine: 134.6 mg/dL
Microalb/Creat Ratio: 8 mg/g{creat} (ref 0–29)
Microalbumin, Urine: 10.7 ug/mL

## 2024-10-19 LAB — URIC ACID: Uric Acid: 8.3 mg/dL (ref 3.8–8.4)

## 2024-10-19 MED ORDER — FEBUXOSTAT 40 MG PO TABS: 40.0000 mg | ORAL_TABLET | Freq: Every day | ORAL | 0 refills | Status: DC

## 2024-11-09 ENCOUNTER — Other Ambulatory Visit

## 2024-11-09 DIAGNOSIS — R7989 Other specified abnormal findings of blood chemistry: Secondary | ICD-10-CM

## 2024-11-09 LAB — COMPREHENSIVE METABOLIC PANEL WITH GFR
ALT: 16 IU/L (ref 0–44)
AST: 17 IU/L (ref 0–40)
Albumin: 4 g/dL (ref 3.8–4.9)
Alkaline Phosphatase: 102 IU/L (ref 47–123)
BUN/Creatinine Ratio: 11 (ref 9–20)
BUN: 19 mg/dL (ref 6–24)
Bilirubin Total: 1 mg/dL (ref 0.0–1.2)
CO2: 22 mmol/L (ref 20–29)
Calcium: 10 mg/dL (ref 8.7–10.2)
Chloride: 101 mmol/L (ref 96–106)
Creatinine, Ser: 1.75 mg/dL — ABNORMAL HIGH (ref 0.76–1.27)
Globulin, Total: 2.2 g/dL (ref 1.5–4.5)
Glucose: 165 mg/dL — ABNORMAL HIGH (ref 70–99)
Potassium: 5 mmol/L (ref 3.5–5.2)
Sodium: 137 mmol/L (ref 134–144)
Total Protein: 6.2 g/dL (ref 6.0–8.5)
eGFR: 45 mL/min/1.73 — ABNORMAL LOW (ref >59)

## 2024-11-10 ENCOUNTER — Ambulatory Visit: Payer: Self-pay | Admitting: Family Medicine

## 2024-11-10 ENCOUNTER — Other Ambulatory Visit: Payer: Self-pay | Admitting: Family Medicine

## 2024-11-10 DIAGNOSIS — N289 Disorder of kidney and ureter, unspecified: Secondary | ICD-10-CM

## 2024-11-10 DIAGNOSIS — Z8739 Personal history of other diseases of the musculoskeletal system and connective tissue: Secondary | ICD-10-CM

## 2024-11-10 DIAGNOSIS — R7989 Other specified abnormal findings of blood chemistry: Secondary | ICD-10-CM

## 2024-11-14 NOTE — Telephone Encounter (Signed)
 Reviewed results with patient and he voiced understanding. He made a lab appt for this Friday to recheck CMP with GFR. Future lab order has been placed.

## 2024-11-17 ENCOUNTER — Other Ambulatory Visit

## 2024-11-17 DIAGNOSIS — R7989 Other specified abnormal findings of blood chemistry: Secondary | ICD-10-CM

## 2024-11-18 LAB — COMPREHENSIVE METABOLIC PANEL WITH GFR
ALT: 18 IU/L (ref 0–44)
AST: 20 IU/L (ref 0–40)
Albumin: 4.6 g/dL (ref 3.8–4.9)
Alkaline Phosphatase: 123 IU/L (ref 47–123)
BUN/Creatinine Ratio: 13 (ref 9–20)
BUN: 20 mg/dL (ref 6–24)
Bilirubin Total: 1.2 mg/dL (ref 0.0–1.2)
CO2: 21 mmol/L (ref 20–29)
Calcium: 9.5 mg/dL (ref 8.7–10.2)
Chloride: 98 mmol/L (ref 96–106)
Creatinine, Ser: 1.57 mg/dL — ABNORMAL HIGH (ref 0.76–1.27)
Globulin, Total: 2.3 g/dL (ref 1.5–4.5)
Glucose: 140 mg/dL — ABNORMAL HIGH (ref 70–99)
Potassium: 4.4 mmol/L (ref 3.5–5.2)
Sodium: 138 mmol/L (ref 134–144)
Total Protein: 6.9 g/dL (ref 6.0–8.5)
eGFR: 51 mL/min/1.73 — ABNORMAL LOW (ref >59)

## 2024-11-20 ENCOUNTER — Ambulatory Visit: Payer: Self-pay | Admitting: Family Medicine

## 2024-11-23 ENCOUNTER — Other Ambulatory Visit (HOSPITAL_COMMUNITY): Payer: Self-pay | Admitting: Nephrology

## 2024-11-23 DIAGNOSIS — I129 Hypertensive chronic kidney disease with stage 1 through stage 4 chronic kidney disease, or unspecified chronic kidney disease: Secondary | ICD-10-CM

## 2024-11-23 DIAGNOSIS — N1831 Chronic kidney disease, stage 3a: Secondary | ICD-10-CM

## 2024-11-23 DIAGNOSIS — N281 Cyst of kidney, acquired: Secondary | ICD-10-CM

## 2024-11-23 DIAGNOSIS — E1122 Type 2 diabetes mellitus with diabetic chronic kidney disease: Secondary | ICD-10-CM

## 2025-01-18 ENCOUNTER — Ambulatory Visit: Admitting: Family Medicine

## 2025-02-09 ENCOUNTER — Other Ambulatory Visit (HOSPITAL_COMMUNITY)
